# Patient Record
Sex: Male | Born: 1980
Health system: Southern US, Community
[De-identification: ages and names within clinical notes are randomized; demographics above are authoritative.]

---

## 2018-03-24 ENCOUNTER — Encounter: Payer: Self-pay | Admitting: Family Medicine

## 2018-03-24 ENCOUNTER — Ambulatory Visit (INDEPENDENT_AMBULATORY_CARE_PROVIDER_SITE_OTHER): Payer: 59 | Admitting: Family Medicine

## 2018-03-24 VITALS — BP 106/68 | HR 69 | Temp 97.9°F | Ht 68.0 in | Wt 146.0 lb

## 2018-03-24 DIAGNOSIS — M545 Low back pain, unspecified: Secondary | ICD-10-CM

## 2018-03-24 MED ORDER — CYCLOBENZAPRINE HCL 10 MG PO TABS: 10.0000 mg | ORAL_TABLET | Freq: Three times a day (TID) | ORAL | 0 refills | Status: DC | PRN

## 2018-03-24 MED ORDER — DICLOFENAC SODIUM 75 MG PO TBEC: 75.0000 mg | DELAYED_RELEASE_TABLET | Freq: Two times a day (BID) | ORAL | 0 refills | Status: DC

## 2018-03-24 NOTE — Progress Notes (Signed)
Subjective:  Cody [name] is a 37 y.o. male who presents today with a chief complaint of back pain and to establish care. History provided by patient via interpretor.  HPI:  Back Pain, Acute Issue Started about 10 days ago. Stable over that time. Pain located in left lower back. Intermittent in nature. No obvious precipitating events. No dysuria or hematuria. No constipation or diarrhea. No radiation. No treatments tried. No other obvious alleviating or aggravating factors.   ROS: Per HPI, otherwise a complete review of systems was negative.   PMH:  The following were reviewed and entered/updated in epic: History reviewed. No pertinent past medical history. There are no active problems to display for this patient.  History reviewed. No pertinent surgical history.  Family History  Problem Relation Age of Onset  . Cancer Mother     Medications- reviewed and updated Current Outpatient Medications  Medication Sig Dispense Refill  . cyclobenzaprine (FLEXERIL) 10 MG tablet Take 1 tablet (10 mg total) by mouth 3 (three) times daily as needed for muscle spasms. 30 tablet 0  . diclofenac (VOLTAREN) 75 MG EC tablet Take 1 tablet (75 mg total) by mouth 2 (two) times daily. 30 tablet 0   No current facility-administered medications for this visit.     Allergies-reviewed and updated No Known Allergies  Social History   Socioeconomic History  . Marital status: Married    Spouse name: Not on file  . Number of children: Not on file  . Years of education: Not on file  . Highest education level: Not on file  Occupational History  . Not on file  Social Needs  . Financial resource strain: Not on file  . Food insecurity:    Worry: Not on file    Inability: Not on file  . Transportation needs:    Medical: Not on file    Non-medical: Not on file  Tobacco Use  . Smoking status: Never Smoker  . Smokeless tobacco: Never Used  Substance and Sexual Activity  . Alcohol use: Never   Frequency: Never  . Drug use: Never  . Sexual activity: Never  Lifestyle  . Physical activity:    Days per week: Not on file    Minutes per session: Not on file  . Stress: Not on file  Relationships  . Social connections:    Talks on phone: Not on file    Gets together: Not on file    Attends religious service: Not on file    Active member of club or organization: Not on file    Attends meetings of clubs or organizations: Not on file    Relationship status: Not on file  Other Topics Concern  . Not on file  Social History Narrative  . Not on file   Objective:  Physical Exam: BP 106/68 (BP Location: Right Arm)   Pulse 69   Temp 97.9 F (36.6 C) (Oral)   Ht 5\' 8"  (1.727 m)   Wt 146 lb (66.2 kg)   SpO2 98%   BMI 22.20 kg/m   Gen: NAD, resting comfortably CV: RRR with no murmurs appreciated Pulm: NWOB, CTAB with no crackles, wheezes, or rhonchi GI: Normal bowel sounds present. Soft, Nontender, Nondistended. MSK: -Back: No deformities.  No spinous process tenderness.  Mildly tender to palpation along left lumbar paraspinal muscles. -Legs: No deformities.  Strength 5 out of 5 throughout.  Sensation light touch intact throughout.  Patellar reflexes 2+ and symmetric bilaterally.  Straight leg raise negative bilaterally.  Skin: Warm, dry Neuro: Grossly normal, moves all extremities Psych: Normal affect and thought content  Assessment/Plan:  Back Pain No red flag signs or symptoms.  Likely secondary to muscular strain.  Discussed conservative treatment.  Will start diclofenac 75 mg twice daily for the next 2 weeks.  Also start Flexeril 10 mg 3 times daily as needed.  Also recommended use of heat/ice therapy.  Also discussed home exercise program.  Return precautions reviewed.  Preventative healthcare Patient will return soon for CPE.  Algis Greenhouse. Jerline Pain, MD 03/24/2018 3:40 PM

## 2018-03-24 NOTE — Patient Instructions (Signed)
Start the diclofenac.  Take 1 pill twice daily for the next 2 weeks. Take Flexeril as needed. Please work on the exercises.  Please let me know if your symptoms are not improving over the next several weeks.  Please come back soon for your full physical with blood work.  Take care, Dr. Jerline Pain

## 2018-03-27 ENCOUNTER — Ambulatory Visit: Payer: Self-pay | Admitting: Family Medicine

## 2018-04-03 ENCOUNTER — Encounter: Payer: Self-pay | Admitting: Family Medicine

## 2018-04-03 ENCOUNTER — Ambulatory Visit (INDEPENDENT_AMBULATORY_CARE_PROVIDER_SITE_OTHER): Payer: 59 | Admitting: Family Medicine

## 2018-04-03 VITALS — BP 100/62 | HR 62 | Temp 98.0°F | Resp 12 | Ht 67.25 in | Wt 147.0 lb

## 2018-04-03 DIAGNOSIS — M545 Low back pain, unspecified: Secondary | ICD-10-CM

## 2018-04-03 DIAGNOSIS — Z0001 Encounter for general adult medical examination with abnormal findings: Secondary | ICD-10-CM | POA: Diagnosis not present

## 2018-04-03 DIAGNOSIS — Z13220 Encounter for screening for lipoid disorders: Secondary | ICD-10-CM

## 2018-04-03 DIAGNOSIS — Z114 Encounter for screening for human immunodeficiency virus [HIV]: Secondary | ICD-10-CM | POA: Diagnosis not present

## 2018-04-03 LAB — LIPID PANEL
CHOLESTEROL: 150 mg/dL (ref 0–200)
HDL: 44.7 mg/dL (ref >39.00)
LDL Cholesterol: 86 mg/dL (ref 0–99)
NonHDL: 104.86
Total CHOL/HDL Ratio: 3
Triglycerides: 92 mg/dL (ref 0.0–149.0)
VLDL: 18.4 mg/dL (ref 0.0–40.0)

## 2018-04-03 LAB — CBC
HEMATOCRIT: 44.2 % (ref 39.0–52.0)
HEMOGLOBIN: 15.2 g/dL (ref 13.0–17.0)
MCHC: 34.3 g/dL (ref 30.0–36.0)
MCV: 86.6 fl (ref 78.0–100.0)
Platelets: 285 10*3/uL (ref 150.0–400.0)
RBC: 5.11 Mil/uL (ref 4.22–5.81)
RDW: 12.6 % (ref 11.5–15.5)
WBC: 4.9 10*3/uL (ref 4.0–10.5)

## 2018-04-03 LAB — COMPREHENSIVE METABOLIC PANEL
ALBUMIN: 4.2 g/dL (ref 3.5–5.2)
ALT: 17 U/L (ref 0–53)
AST: 20 U/L (ref 0–37)
Alkaline Phosphatase: 56 U/L (ref 39–117)
BUN: 18 mg/dL (ref 6–23)
CHLORIDE: 104 meq/L (ref 96–112)
CO2: 30 mEq/L (ref 19–32)
Calcium: 8.7 mg/dL (ref 8.4–10.5)
Creatinine, Ser: 1.02 mg/dL (ref 0.40–1.50)
GFR: 86.74 mL/min (ref >60.00)
GLUCOSE: 86 mg/dL (ref 70–99)
POTASSIUM: 3.4 meq/L — AB (ref 3.5–5.1)
Sodium: 139 mEq/L (ref 135–145)
TOTAL PROTEIN: 6.9 g/dL (ref 6.0–8.3)
Total Bilirubin: 0.8 mg/dL (ref 0.2–1.2)

## 2018-04-03 NOTE — Patient Instructions (Signed)
Preventive Care 18-39 Years, Male Preventive care refers to lifestyle choices and visits with your health care provider that can promote health and wellness. What does preventive care include?  A yearly physical exam. This is also called an annual well check.  Dental exams once or twice a year.  Routine eye exams. Ask your health care provider how often you should have your eyes checked.  Personal lifestyle choices, including: ? Daily care of your teeth and gums. ? Regular physical activity. ? Eating a healthy diet. ? Avoiding tobacco and drug use. ? Limiting alcohol use. ? Practicing safe sex. What happens during an annual well check? The services and screenings done by your health care provider during your annual well check will depend on your age, overall health, lifestyle risk factors, and family history of disease. Counseling Your health care provider may ask you questions about your:  Alcohol use.  Tobacco use.  Drug use.  Emotional well-being.  Home and relationship well-being.  Sexual activity.  Eating habits.  Work and work Statistician.  Screening You may have the following tests or measurements:  Height, weight, and BMI.  Blood pressure.  Lipid and cholesterol levels. These may be checked every 5 years starting at age 34.  Diabetes screening. This is done by checking your blood sugar (glucose) after you have not eaten for a while (fasting).  Skin check.  Hepatitis C blood test.  Hepatitis B blood test.  Sexually transmitted disease (STD) testing.  Discuss your test results, treatment options, and if necessary, the need for more tests with your health care provider. Vaccines Your health care provider may recommend certain vaccines, such as:  Influenza vaccine. This is recommended every year.  Tetanus, diphtheria, and acellular pertussis (Tdap, Td) vaccine. You may need a Td booster every 10 years.  Varicella vaccine. You may need this if you  have not been vaccinated.  HPV vaccine. If you are 23 or younger, you may need three doses over 6 months.  Measles, mumps, and rubella (MMR) vaccine. You may need at least one dose of MMR.You may also need a second dose.  Pneumococcal 13-valent conjugate (PCV13) vaccine. You may need this if you have certain conditions and have not been vaccinated.  Pneumococcal polysaccharide (PPSV23) vaccine. You may need one or two doses if you smoke cigarettes or if you have certain conditions.  Meningococcal vaccine. One dose is recommended if you are age 65-21 years and a first-year college student living in a residence hall, or if you have one of several medical conditions. You may also need additional booster doses.  Hepatitis A vaccine. You may need this if you have certain conditions or if you travel or work in places where you may be exposed to hepatitis A.  Hepatitis B vaccine. You may need this if you have certain conditions or if you travel or work in places where you may be exposed to hepatitis B.  Haemophilus influenzae type b (Hib) vaccine. You may need this if you have certain risk factors.  Talk to your health care provider about which screenings and vaccines you need and how often you need them. This information is not intended to replace advice given to you by your health care provider. Make sure you discuss any questions you have with your health care provider. Document Released: 02/08/2002 Document Revised: 09/01/2016 Document Reviewed: 10/14/2015 Elsevier Interactive Patient Education  [employer].

## 2018-04-03 NOTE — Progress Notes (Signed)
Subjective:  Cody [name] is a 37 y.o. male who presents today for his annual comprehensive physical exam.    HPI:  He has no acute complaints today.   Lifestyle Diet: No specific diets.  Exercise: 20 minutes to 1 hour running daily. Likes to walk and run.   Depression screen PHQ 2/9 03/24/2018  Decreased Interest 0  Down, Depressed, Hopeless 0  PHQ - 2 Score 0    Health Maintenance Due  Topic Date Due  . HIV Screening  12/19/1994  . TETANUS/TDAP  12/19/1998     ROS: A complete review of systems was negative.   PMH:  The following were reviewed and entered/updated in epic: History reviewed. No pertinent past medical history. There are no active problems to display for this patient.  History reviewed. No pertinent surgical history.  Family History  Problem Relation Age of Onset  . Cancer Mother    Medications- reviewed and updated Current Outpatient Medications  Medication Sig Dispense Refill  . cyclobenzaprine (FLEXERIL) 10 MG tablet Take 1 tablet (10 mg total) by mouth 3 (three) times daily as needed for muscle spasms. 30 tablet 0  . diclofenac (VOLTAREN) 75 MG EC tablet Take 1 tablet (75 mg total) by mouth 2 (two) times daily. 30 tablet 0   No current facility-administered medications for this visit.     Allergies-reviewed and updated No Known Allergies  Social History   Socioeconomic History  . Marital status: Married    Spouse name: Not on file  . Number of children: Not on file  . Years of education: Not on file  . Highest education level: Not on file  Occupational History  . Not on file  Social Needs  . Financial resource strain: Not on file  . Food insecurity:    Worry: Not on file    Inability: Not on file  . Transportation needs:    Medical: Not on file    Non-medical: Not on file  Tobacco Use  . Smoking status: Never Smoker  . Smokeless tobacco: Never Used  Substance and Sexual Activity  . Alcohol use: Never    Frequency: Never  .  Drug use: Never  . Sexual activity: Never  Lifestyle  . Physical activity:    Days per week: Not on file    Minutes per session: Not on file  . Stress: Not on file  Relationships  . Social connections:    Talks on phone: Not on file    Gets together: Not on file    Attends religious service: Not on file    Active member of club or organization: Not on file    Attends meetings of clubs or organizations: Not on file    Relationship status: Not on file  Other Topics Concern  . Not on file  Social History Narrative  . Not on file    Objective:  Physical Exam: BP 100/62 (BP Location: Left Arm)   Pulse 62   Temp 98 F (36.7 C) (Oral)   Resp 12   Ht 5' 7.25" (1.708 m)   Wt 147 lb (66.7 kg)   SpO2 99%   BMI 22.85 kg/m   Body mass index is 22.85 kg/m. Wt Readings from Last 3 Encounters:  04/03/18 147 lb (66.7 kg)  03/24/18 146 lb (66.2 kg)   Gen: NAD, resting comfortably HEENT: TMs normal bilaterally. OP clear. No thyromegaly noted.  CV: RRR with no murmurs appreciated Pulm: NWOB, CTAB with no crackles, wheezes, or rhonchi  GI: Normal bowel sounds present. Soft, Nontender, Nondistended. MSK: no edema, cyanosis, or clubbing noted Skin: warm, dry Neuro: CN2-12 grossly intact. Strength 5/5 in upper and lower extremities. Reflexes symmetric and intact bilaterally.  Psych: Normal affect and thought content  Assessment/Plan:  Low Back Pain Significantly improved. Continue diclofenac and flexeril. Check CBC and CMET.   Preventative Healthcare: Check lipid panel and HIV antibody.  Patient Counseling(The following topics were reviewed and/or handout was given):  -Nutrition: Stressed importance of moderation in sodium/caffeine intake, saturated fat and cholesterol, caloric balance, sufficient intake of fresh fruits, vegetables, and fiber.  -Stressed the importance of regular exercise.   -Substance Abuse: Discussed cessation/primary prevention of tobacco, alcohol, or other drug  use; driving or other dangerous activities under the influence; availability of treatment for abuse.   -Injury prevention: Discussed safety belts, safety helmets, smoke detector, smoking near bedding or upholstery.   -Sexuality: Discussed sexually transmitted diseases, partner selection, use of condoms, avoidance of unintended pregnancy and contraceptive alternatives.   -Dental health: Discussed importance of regular tooth brushing, flossing, and dental visits.  -Health maintenance and immunizations reviewed. Please refer to Health maintenance section.  Return to care in 1 year for next preventative visit.   Algis Greenhouse. Jerline Pain, MD 04/03/2018 8:38 AM

## 2018-04-04 LAB — HIV ANTIBODY (ROUTINE TESTING W REFLEX): HIV 1&2 Ab, 4th Generation: NONREACTIVE

## 2019-09-27 ENCOUNTER — Encounter (HOSPITAL_COMMUNITY): Payer: Self-pay

## 2019-09-27 ENCOUNTER — Ambulatory Visit (HOSPITAL_COMMUNITY)
Admission: EM | Admit: 2019-09-27 | Discharge: 2019-09-27 | Disposition: A | Discharge status: Home / Self Care | Payer: 59 | Attending: Urgent Care | Admitting: Urgent Care

## 2019-09-27 ENCOUNTER — Ambulatory Visit (HOSPITAL_COMMUNITY)
Admission: EM | Admit: 2019-09-27 | Discharge: 2019-09-27 | Disposition: A | Discharge status: Home / Self Care | Payer: Self-pay

## 2019-09-27 ENCOUNTER — Other Ambulatory Visit: Payer: Self-pay

## 2019-09-27 DIAGNOSIS — H9201 Otalgia, right ear: Secondary | ICD-10-CM

## 2019-09-27 DIAGNOSIS — H6981 Other specified disorders of Eustachian tube, right ear: Secondary | ICD-10-CM | POA: Diagnosis not present

## 2019-09-27 MED ORDER — CETIRIZINE HCL 10 MG PO TABS: 10.0000 mg | ORAL_TABLET | Freq: Every day | ORAL | 0 refills | Status: DC

## 2019-09-27 MED ORDER — NAPROXEN 500 MG PO TABS: 500.0000 mg | ORAL_TABLET | Freq: Two times a day (BID) | ORAL | 0 refills | Status: DC

## 2019-09-27 MED ORDER — PSEUDOEPHEDRINE HCL ER 120 MG PO TB12: 120.0000 mg | ORAL_TABLET | Freq: Two times a day (BID) | ORAL | 0 refills | Status: DC | PRN

## 2019-09-27 NOTE — ED Triage Notes (Signed)
Pt states he has right ear discomfort. Pt states he saw some blood coming out of his right ear.

## 2019-09-27 NOTE — Discharge Instructions (Addendum)
Start an antihistamine like Zyrtec for postnasal drainage, sinus congestion and fluid in your sinuses/middle ear space.  Take 10mg  cetirizine (Zyrtec) every day through the end of October. You can take this together with pseudoephedrine (Sudafed) at a dose of 60 mg 3 times a day oral 120 mg twice daily as needed for the same kind of congestion. Take this daily for the next 2-3 days. Then use it only if you need it. For ear pain you can also take naproxen (Naprosyn) 500mg  twice daily if you need it.

## 2019-09-27 NOTE — ED Provider Notes (Signed)
  MRN: [id number] DOB: 06/14/1981  Subjective:   Cody [name] is a 38 y.o. male presenting for several day history of persistent intermittent right ear pain that is mild to moderate in nature, has associated ear popping and occasional dizziness.  He also reports that his eyes get intermittently red but are not painful.  States that he tried to clean it out yesterday with a Q-tip and felt some pain.  He wanted to make sure that he does not have an infection today.  Denies any known COVID-19 contacts.  Denies fever, sinus pain, throat pain, cough, chest pain, shortness of breath, wheezing, nausea, vomiting, belly pain. He is not currently taking any medications and has no known food or drug allergies.  Denies past medical and surgical history.   ROS  Objective:   Vitals: BP 101/63 (BP Location: Right Arm)   Pulse 69   Temp 98.1 F (36.7 C) (Oral)   Resp 16   Wt 120 lb (54.4 kg)   SpO2 98%   BMI 18.66 kg/m   Physical Exam Constitutional:      General: He is not in acute distress.    Appearance: Normal appearance. He is well-developed and normal weight. He is not ill-appearing.  HENT:     Head: Normocephalic and atraumatic.     Comments: Mild resolving abrasion of right distal ear canal.    Right Ear: Tympanic membrane and ear canal normal. There is no impacted cerumen.     Left Ear: Tympanic membrane, ear canal and external ear normal. There is no impacted cerumen.     Nose: Nose normal. No congestion or rhinorrhea.     Mouth/Throat:     Mouth: Mucous membranes are moist.     Pharynx: Oropharynx is clear. No oropharyngeal exudate or posterior oropharyngeal erythema.  Eyes:     General: No scleral icterus.       Right eye: No discharge.        Left eye: No discharge.     Extraocular Movements: Extraocular movements intact.     Conjunctiva/sclera:     Right eye: Right conjunctiva is not injected. No chemosis, exudate or hemorrhage.    Left eye: Left conjunctiva is injected. No  chemosis, exudate or hemorrhage.    Pupils: Pupils are equal, round, and reactive to light.  Neck:     Musculoskeletal: Normal range of motion and neck supple. No neck rigidity or muscular tenderness.  Cardiovascular:     Rate and Rhythm: Normal rate.  Pulmonary:     Effort: Pulmonary effort is normal.  Neurological:     General: No focal deficit present.     Mental Status: He is alert and oriented to person, place, and time.  Psychiatric:        Mood and Affect: Mood normal.        Behavior: Behavior normal.     Assessment and Plan :   1. Eustachian tube dysfunction, right   2. Otalgia of right ear     Counseled patient on eustachian tube dysfunction and will try to address this with daily Zyrtec and pseudoephedrine.  Recommended patient stop using Q-tips as he suffered an abrasion to his right ear canal. Counseled patient on potential for adverse effects with medications prescribed/recommended today, ER and return-to-clinic precautions discussed, patient verbalized understanding.     Jaynee Eagles, PA-C 09/27/19 1301

## 2019-10-08 ENCOUNTER — Other Ambulatory Visit: Payer: Self-pay

## 2019-10-08 ENCOUNTER — Encounter (HOSPITAL_COMMUNITY): Payer: Self-pay

## 2019-10-08 ENCOUNTER — Ambulatory Visit (HOSPITAL_COMMUNITY)
Admission: EM | Admit: 2019-10-08 | Discharge: 2019-10-08 | Disposition: A | Discharge status: Home / Self Care | Payer: 59 | Attending: Family Medicine | Admitting: Family Medicine

## 2019-10-08 DIAGNOSIS — H9201 Otalgia, right ear: Secondary | ICD-10-CM

## 2019-10-08 DIAGNOSIS — H6991 Unspecified Eustachian tube disorder, right ear: Secondary | ICD-10-CM

## 2019-10-08 MED ORDER — FLUTICASONE PROPIONATE 50 MCG/ACT NA SUSP: 1.0000 | Freq: Every day | NASAL | 0 refills | Status: DC

## 2019-10-08 MED ORDER — CETIRIZINE HCL 10 MG PO TABS: 10.0000 mg | ORAL_TABLET | Freq: Every day | ORAL | 0 refills | Status: DC

## 2019-10-08 NOTE — ED Triage Notes (Signed)
Pt following up from previous visit concerning his right ear. Pt right ear is still ringing and possible some hearing loss.

## 2019-10-08 NOTE — Discharge Instructions (Signed)
I do not see any indication for antibiotics here today.  Please use flonase nasal spray daily as this may help with your ear symptoms.  Please continue with daily zyrtec as well.  Please follow up with your PCP and/or ENT for recheck if symptoms persist.

## 2019-10-08 NOTE — ED Provider Notes (Signed)
[facility]    CSN: [id number] Arrival date & time: 10/08/19  1203      History   Chief Complaint Chief Complaint  Patient presents with  . Otalgia  . Sore Throat    HPI Cody [name] is a 38 y.o. male.   Cody [name] presents with complaints of right ear pain. It is intermittent. Feels worse in cold temperatures or if he is near a window with cold air. He sometimes has ringing to the ear and he feels like the hearing is decreased. States he has had similar in the past and took antibiotics. Was seen here for this 10/1 and given antihistamines and decongestants for eustachian tube dysfunction. Patient states that these have minimally helped. No fevers. Sometimes sore throat on the right hand side also.    ROS per HPI, negative if not otherwise mentioned.      History reviewed. No pertinent past medical history.  There are no active problems to display for this patient.   History reviewed. No pertinent surgical history.     Home Medications    Prior to Admission medications   Medication Sig Start Date End Date Taking? Authorizing Provider  cetirizine (ZYRTEC ALLERGY) 10 MG tablet Take 1 tablet (10 mg total) by mouth daily. 10/08/19   Zigmund Gottron, NP  cyclobenzaprine (FLEXERIL) 10 MG tablet Take 1 tablet (10 mg total) by mouth 3 (three) times daily as needed for muscle spasms. 03/24/18   Vivi Barrack, MD  diclofenac (VOLTAREN) 75 MG EC tablet Take 1 tablet (75 mg total) by mouth 2 (two) times daily. 03/24/18   Vivi Barrack, MD  fluticasone (FLONASE) 50 MCG/ACT nasal spray Place 1 spray into both nostrils daily. 10/08/19   Zigmund Gottron, NP  naproxen (NAPROSYN) 500 MG tablet Take 1 tablet (500 mg total) by mouth 2 (two) times daily. 09/27/19   Jaynee Eagles, PA-C  pseudoephedrine (SUDAFED 12 HOUR) 120 MG 12 hr tablet Take 1 tablet (120 mg total) by mouth every 12 (twelve) hours as needed for congestion. 09/27/19   Jaynee Eagles, PA-C    Family History  Family History  Problem Relation Age of Onset  . Cancer Mother     Social History Social History   Tobacco Use  . Smoking status: Never Smoker  . Smokeless tobacco: Never Used  Substance Use Topics  . Alcohol use: Never    Frequency: Never  . Drug use: Never     Allergies   Patient has no known allergies.   Review of Systems Review of Systems   Physical Exam Triage Vital Signs ED Triage Vitals  Enc Vitals Group     BP 10/08/19 1255 115/72     Pulse Rate 10/08/19 1255 79     Resp 10/08/19 1255 16     Temp 10/08/19 1255 97.9 F (36.6 C)     Temp Source 10/08/19 1255 Skin     SpO2 10/08/19 1255 98 %     Weight --      Height --      Head Circumference --      Peak Flow --      Pain Score 10/08/19 1253 2     Pain Loc --      Pain Edu? --      Excl. in [place]? --    No data found.  Updated Vital Signs BP 115/72 (BP Location: Right Arm)   Pulse 79   Temp 97.9 F (36.6 C) (Skin)  Resp 16   SpO2 98%    Physical Exam Constitutional:      Appearance: He is well-developed.  HENT:     Right Ear: Tympanic membrane and ear canal normal. No drainage or swelling. No middle ear effusion.     Left Ear: Tympanic membrane and ear canal normal.     Mouth/Throat:     Tonsils: No tonsillar exudate or tonsillar abscesses. 1+ on the right. 1+ on the left.  Cardiovascular:     Rate and Rhythm: Normal rate and regular rhythm.  Pulmonary:     Effort: Pulmonary effort is normal.     Breath sounds: Normal breath sounds.  Skin:    General: Skin is warm and dry.  Neurological:     Mental Status: He is alert and oriented to person, place, and time.      UC Treatments / Results  Labs (all labs ordered are listed, but only abnormal results are displayed) Labs Reviewed - No data to display  EKG   Radiology No results found.  Procedures Procedures (including critical care time)  Medications Ordered in UC Medications - No data to display  Initial Impression /  Assessment and Plan / UC Course  I have reviewed the triage vital signs and the nursing notes.  Pertinent labs & imaging results that were available during my care of the patient were reviewed by me and considered in my medical decision making (see chart for details).     Ear appears generally WNL here today without acute findings. Patient concerned he needs antibiotics but I do not see any indication for this today. flonase added to regimen for eustachian tube disorder. Encouraged follow up with his pcp and/or ENT. Patient verbalized understanding and agreeable to plan.    Final Clinical Impressions(s) / UC Diagnoses   Final diagnoses:  Otalgia of right ear  Eustachian tube disorder, right     Discharge Instructions     I do not see any indication for antibiotics here today.  Please use flonase nasal spray daily as this may help with your ear symptoms.  Please continue with daily zyrtec as well.  Please follow up with your PCP and/or ENT for recheck if symptoms persist.    ED Prescriptions    Medication Sig Dispense Auth. Provider   cetirizine (ZYRTEC ALLERGY) 10 MG tablet Take 1 tablet (10 mg total) by mouth daily. 90 tablet Brenetta Penny, Lanelle Bal B, NP   fluticasone (FLONASE) 50 MCG/ACT nasal spray Place 1 spray into both nostrils daily. 16 g Zigmund Gottron, NP     PDMP not reviewed this encounter.   Zigmund Gottron, NP 10/08/19 1432

## 2019-10-15 ENCOUNTER — Ambulatory Visit (INDEPENDENT_AMBULATORY_CARE_PROVIDER_SITE_OTHER): Payer: 59 | Admitting: Family Medicine

## 2019-10-15 ENCOUNTER — Other Ambulatory Visit: Payer: Self-pay

## 2019-10-15 DIAGNOSIS — H9201 Otalgia, right ear: Secondary | ICD-10-CM | POA: Diagnosis not present

## 2019-10-15 MED ORDER — AMOXICILLIN 875 MG PO TABS: 875.0000 mg | ORAL_TABLET | Freq: Two times a day (BID) | ORAL | 0 refills | Status: DC

## 2019-10-15 MED ORDER — PREDNISONE 50 MG PO TABS: ORAL_TABLET | ORAL | 0 refills | Status: DC

## 2019-10-15 NOTE — Progress Notes (Signed)
   Chief Complaint:  Cody [name] is a 38 y.o. male who presents for same day appointment with a chief complaint of ear [name].   Assessment/Plan:  Otalgia Patient with clear effusion and evidence of eustachian tube dysfunction.  He has tried and failed Flonase, Zyrtec, and Sudafed.  Will start 5-day burst of prednisone.  Also send in a "pocket prescription" for amoxicillin with strict instruction to not start unless symptoms do not improve the next couple of days.  If continues have issues, will need referral to ENT.  Discussed reasons to return to care.     Subjective:  HPI:  Ear [name], acute problem Started 2 to 3 weeks ago.  Located in right ear.  Has went to urgent care twice.  Was started on Flonase and pseudoephedrine.  Has not had any significant improvement.  Has some sore throat in the morning.  No reported fevers or chills.  No other treatments tried.  Symptoms are stable.  No other obvious alleviating or aggravating factors.    ROS: Per HPI  PMH: He reports that he has never smoked. He has never used smokeless tobacco. He reports that he does not drink alcohol or use drugs.      Objective:  Physical Exam: Gen: NAD, resting comfortably HEENT: Right TM with clear effusion.  Left TM with clear effusion.  Oropharynx slightly erythematous.  No exudate.     Cody [name]. Cody Pain, MD 10/15/2019 1:14 PM

## 2019-10-25 ENCOUNTER — Other Ambulatory Visit: Payer: Self-pay

## 2019-10-25 ENCOUNTER — Telehealth: Payer: Self-pay | Admitting: Family Medicine

## 2019-10-25 MED ORDER — AMOXICILLIN 875 MG PO TABS: 875.0000 mg | ORAL_TABLET | Freq: Two times a day (BID) | ORAL | 0 refills | Status: DC

## 2019-10-25 MED ORDER — PREDNISONE 50 MG PO TABS: ORAL_TABLET | ORAL | 0 refills | Status: DC

## 2019-10-25 NOTE — Telephone Encounter (Signed)
See note  Copied from [facility] [id number]. Topic: General - Other >> Oct 25, 2019  2:27 PM Carolyn Stare wrote: Pt said he better but his ear still is not better req a refill on below meds   amoxicillin (AMOXIL) 875 MG tablet    predniSONE (DELTASONE) 50 MG tablet   [facility]

## 2019-10-25 NOTE — Telephone Encounter (Signed)
Rx sent to pharmacy   

## 2019-10-25 NOTE — Telephone Encounter (Signed)
[place] with one more round of antibiotics and prednisone. Will need ENT referral if still having issues.  Algis Greenhouse. Jerline Pain, MD 10/25/2019 2:55 PM

## 2019-10-25 NOTE — Telephone Encounter (Signed)
Please advise 

## 2019-12-05 ENCOUNTER — Other Ambulatory Visit: Payer: Self-pay | Admitting: Otolaryngology

## 2019-12-05 DIAGNOSIS — E010 Iodine-deficiency related diffuse (endemic) goiter: Secondary | ICD-10-CM

## 2019-12-13 ENCOUNTER — Ambulatory Visit
Admission: RE | Admit: 2019-12-13 | Discharge: 2019-12-13 | Disposition: A | Discharge status: Home / Self Care | Payer: 59 | Source: Ambulatory Visit | Attending: Otolaryngology | Admitting: Otolaryngology

## 2019-12-13 DIAGNOSIS — E010 Iodine-deficiency related diffuse (endemic) goiter: Secondary | ICD-10-CM

## 2019-12-24 ENCOUNTER — Other Ambulatory Visit: Payer: Self-pay | Admitting: Otolaryngology

## 2019-12-24 DIAGNOSIS — H918X1 Other specified hearing loss, right ear: Secondary | ICD-10-CM

## 2019-12-24 DIAGNOSIS — IMO0001 Reserved for inherently not codable concepts without codable children: Secondary | ICD-10-CM

## 2019-12-24 DIAGNOSIS — H9311 Tinnitus, right ear: Secondary | ICD-10-CM

## 2020-08-09 IMAGING — US US THYROID
1 series · 14 of 25 positions shown · non-contrast
Comparison: None.

CLINICAL DATA: 37-year-old male with a history of thyroid
enlargement

EXAM:
THYROID ULTRASOUND
TECHNIQUE: Ultrasound examination of the thyroid gland and adjacent soft
tissues was performed.

[Series 1: us thyroid · 0.04mm/px · 14 of 39 slices shown]
[im 1/39]
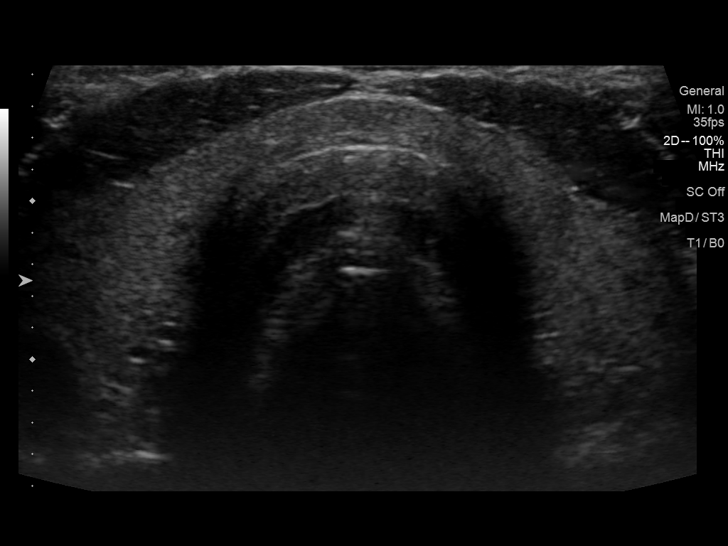
[im 4/39]
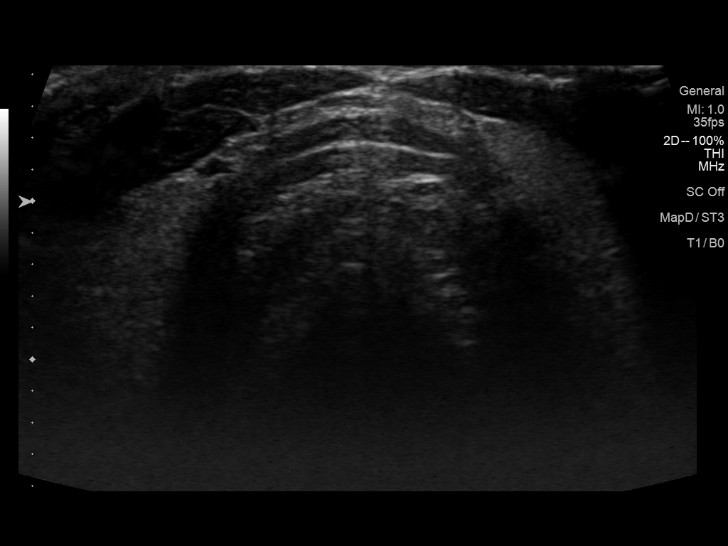
[im 7/39]
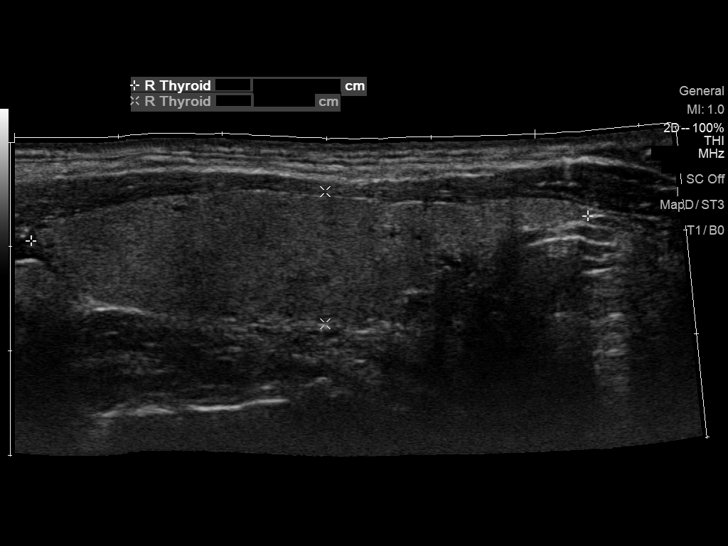
[im 10/39]
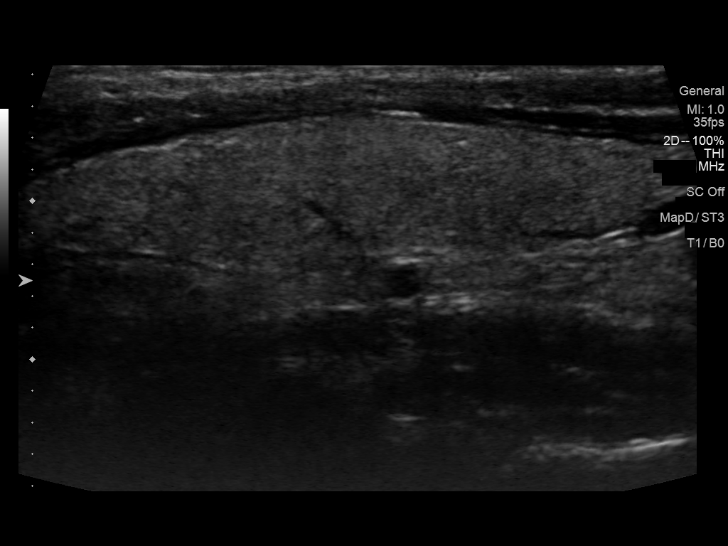
[im 13/39]
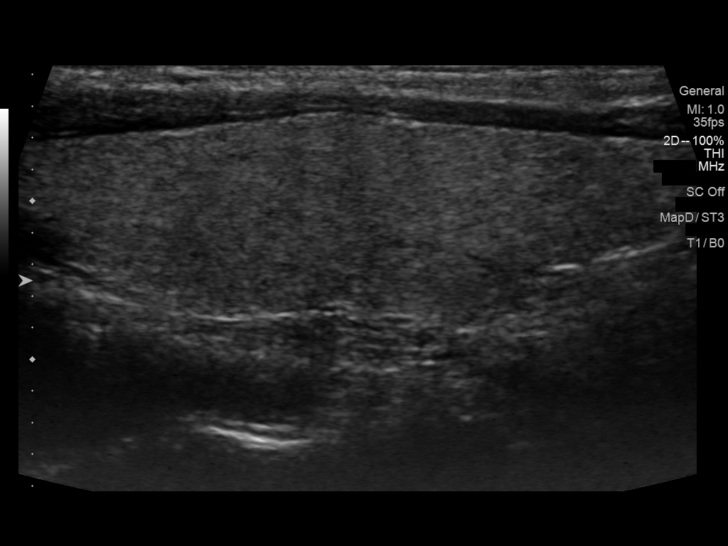
[im 15/39]
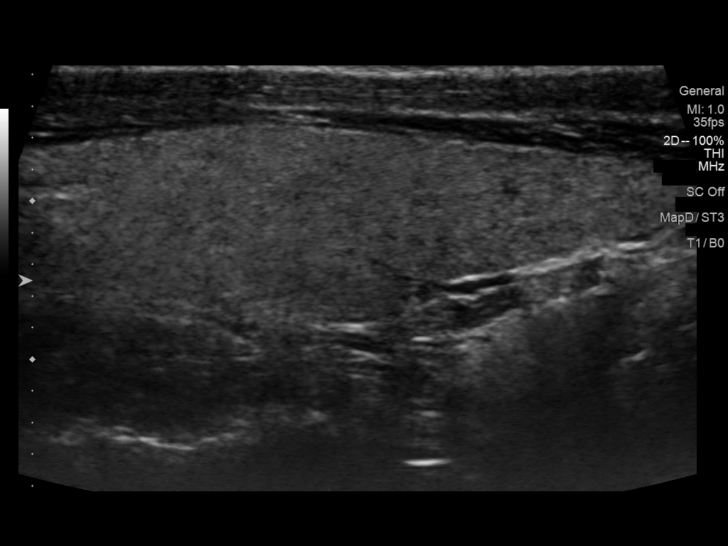
[im 18/39]
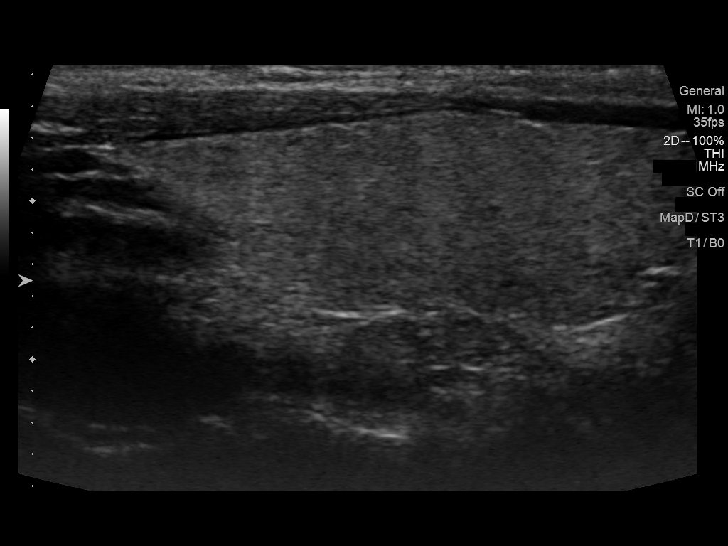
[im 21/39]
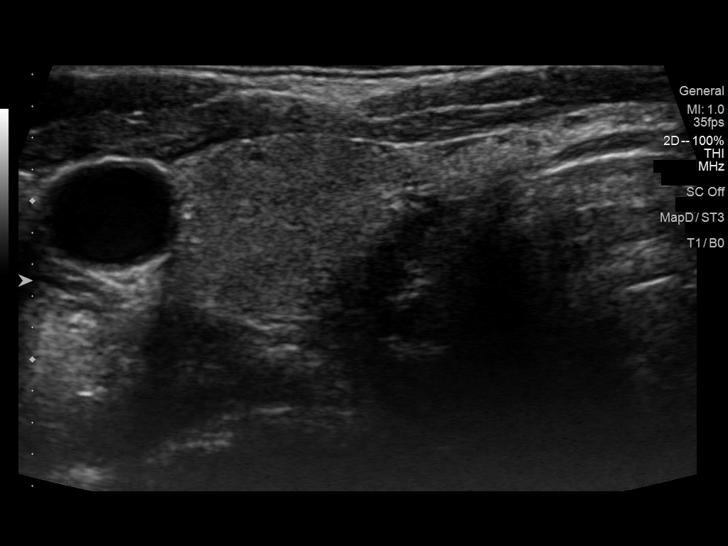
[im 24/39]
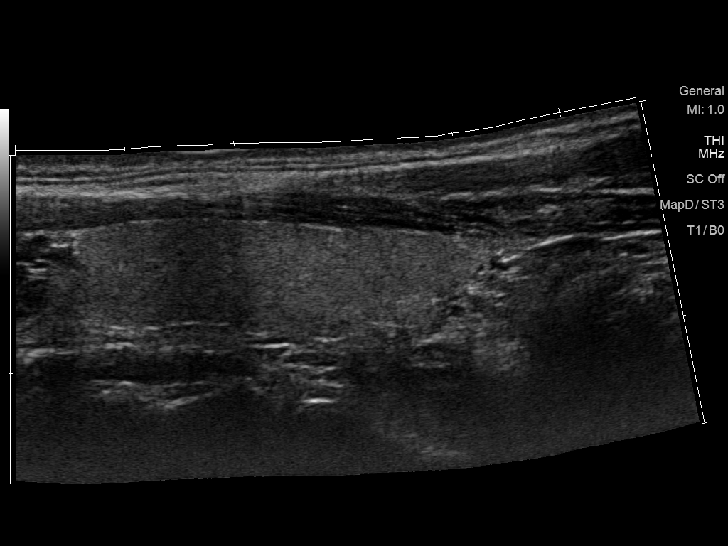
[im 26/39]
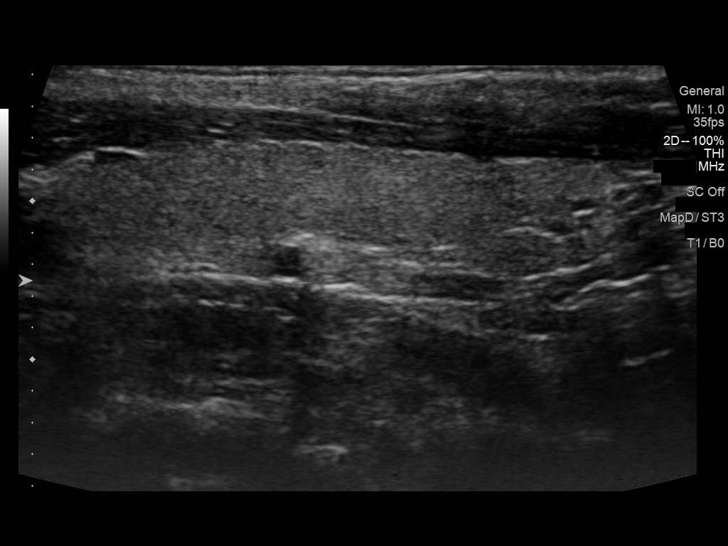
[im 29/39]
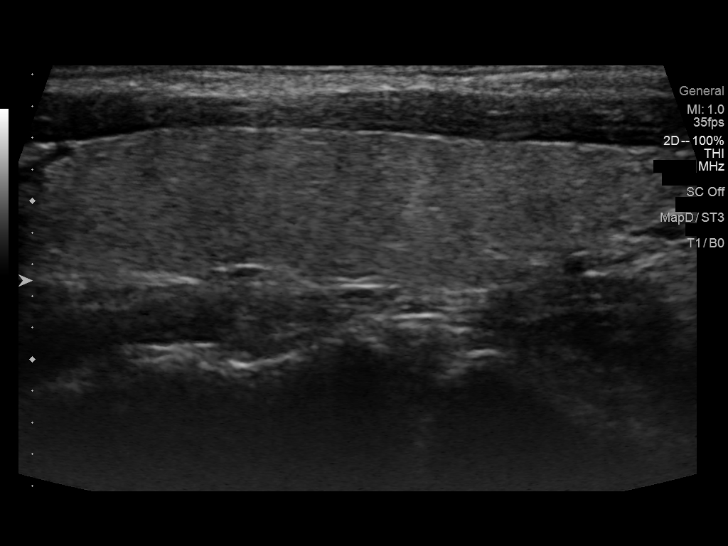
[im 32/39]
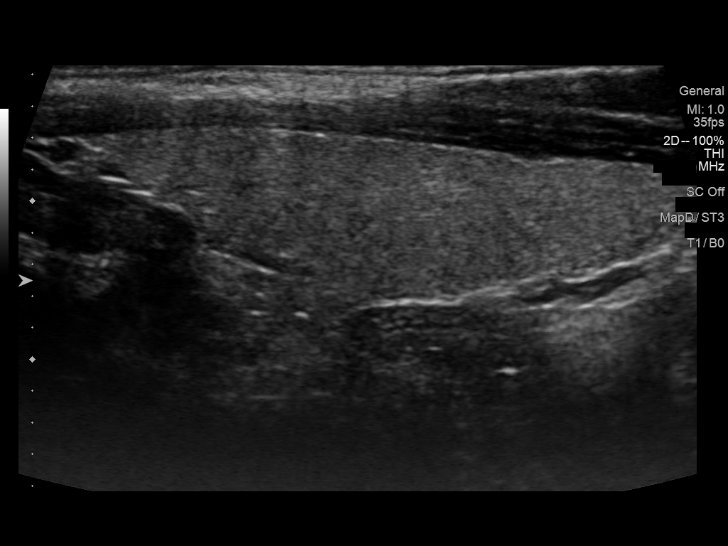
[im 35/39]
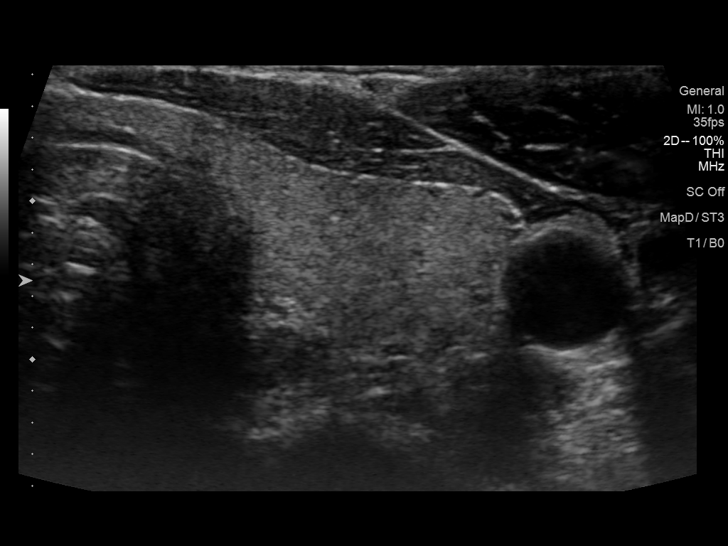
[im 39/39]
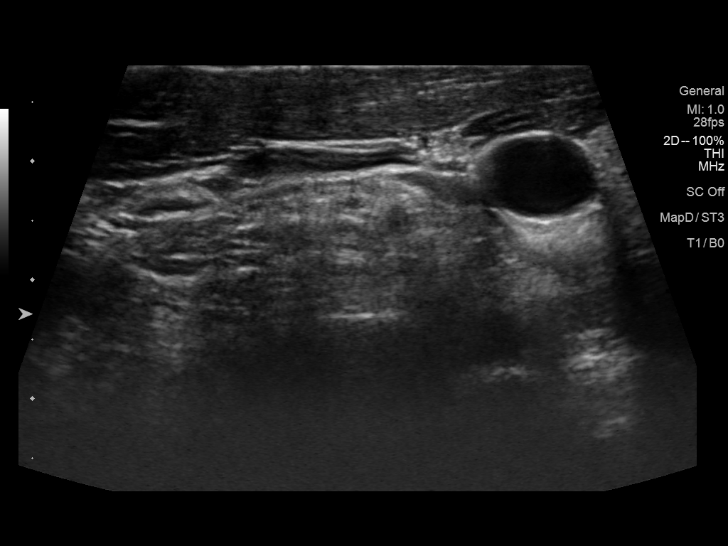

[14 of 25 positions shown; findings below may reference images not displayed]

FINDINGS: Parenchymal Echotexture: Normal

Isthmus: 0.3 cm

Right lobe: 5.3 cm x 1.3 cm x 1.7 cm

Left lobe: 3.9 cm x 1.1 cm x 1.7 cm

_________________________________________________________

Estimated total number of nodules >/= 1 cm: 0

Number of spongiform nodules >/=  2 cm not described below (TR1): 0

Number of mixed cystic and solid nodules >/= 1.5 cm not described
below (TR2): 0

_________________________________________________________

No discrete nodules are seen within the thyroid gland.

No adenopathy
IMPRESSION: Relatively unremarkable sonographic survey of the thyroid.

## 2022-07-05 ENCOUNTER — Ambulatory Visit (INDEPENDENT_AMBULATORY_CARE_PROVIDER_SITE_OTHER): Payer: 59 | Admitting: Family Medicine

## 2022-07-05 ENCOUNTER — Encounter: Payer: Self-pay | Admitting: Family Medicine

## 2022-07-05 VITALS — BP 102/64 | HR 77 | Temp 98.0°F | Ht 67.0 in | Wt 142.8 lb

## 2022-07-05 DIAGNOSIS — Z131 Encounter for screening for diabetes mellitus: Secondary | ICD-10-CM | POA: Diagnosis not present

## 2022-07-05 DIAGNOSIS — D179 Benign lipomatous neoplasm, unspecified: Secondary | ICD-10-CM

## 2022-07-05 DIAGNOSIS — Z0001 Encounter for general adult medical examination with abnormal findings: Secondary | ICD-10-CM | POA: Diagnosis not present

## 2022-07-05 DIAGNOSIS — Z13220 Encounter for screening for lipoid disorders: Secondary | ICD-10-CM

## 2022-07-05 DIAGNOSIS — Z23 Encounter for immunization: Secondary | ICD-10-CM

## 2022-07-05 HISTORY — DX: Benign lipomatous neoplasm, unspecified: D17.9

## 2022-07-05 LAB — COMPREHENSIVE METABOLIC PANEL
ALT: 19 U/L (ref 0–53)
AST: 20 U/L (ref 0–37)
Albumin: 4.3 g/dL (ref 3.5–5.2)
Alkaline Phosphatase: 61 U/L (ref 39–117)
BUN: 15 mg/dL (ref 6–23)
CO2: 30 mEq/L (ref 19–32)
Calcium: 9.1 mg/dL (ref 8.4–10.5)
Chloride: 104 mEq/L (ref 96–112)
Creatinine, Ser: 0.93 mg/dL (ref 0.40–1.50)
GFR: 101.25 mL/min (ref >60.00)
Glucose, Bld: 79 mg/dL (ref 70–99)
Potassium: 3.3 mEq/L — ABNORMAL LOW (ref 3.5–5.1)
Sodium: 141 mEq/L (ref 135–145)
Total Bilirubin: 1 mg/dL (ref 0.2–1.2)
Total Protein: 6.7 g/dL (ref 6.0–8.3)

## 2022-07-05 LAB — LIPID PANEL
Cholesterol: 148 mg/dL (ref 0–200)
HDL: 54.9 mg/dL (ref >39.00)
LDL Cholesterol: 75 mg/dL (ref 0–99)
NonHDL: 92.97
Total CHOL/HDL Ratio: 3
Triglycerides: 92 mg/dL (ref 0.0–149.0)
VLDL: 18.4 mg/dL (ref 0.0–40.0)

## 2022-07-05 LAB — CBC
HCT: 41.8 % (ref 39.0–52.0)
Hemoglobin: 14.2 g/dL (ref 13.0–17.0)
MCHC: 34.1 g/dL (ref 30.0–36.0)
MCV: 87 fl (ref 78.0–100.0)
Platelets: 242 10*3/uL (ref 150.0–400.0)
RBC: 4.8 Mil/uL (ref 4.22–5.81)
RDW: 12.9 % (ref 11.5–15.5)
WBC: 4.8 10*3/uL (ref 4.0–10.5)

## 2022-07-05 LAB — TSH: TSH: 1.59 u[IU]/mL (ref 0.35–5.50)

## 2022-07-05 LAB — HEMOGLOBIN A1C: Hgb A1c MFr Bld: 5.5 % (ref 4.6–6.5)

## 2022-07-05 NOTE — Assessment & Plan Note (Signed)
No red flags.  It is reassuring that this has been present for the past year without much change.  We discussed watchful waiting versus imaging.  We will continue with watchful waiting for now.  We discussed reasons to return to care.  Follow-up as needed.

## 2022-07-05 NOTE — Addendum Note (Signed)
Addended by: Betti Cruz on: 07/05/2022 10:58 AM   Modules accepted: Orders

## 2022-07-05 NOTE — Patient Instructions (Signed)
It was very nice to see you today!  You probably have a lipoma.  Please let me know if this increases in size or becomes painful.  We will give your tetanus shot today and check blood work.  Please continue to work on diet and exercise.  I will see back in year.  Come back sooner if needed.  Take care, Dr Jerline Pain  PLEASE NOTE:  If you had any lab tests please let us know if you have not heard back within a few days. You may see your results on mychart before we have a chance to review them but we will give you a call once they are reviewed by [place]. If we ordered any referrals today, please let us know if you have not heard from their office within the next week.   Please try these tips to maintain a healthy lifestyle:  Eat at least 3 REAL meals and 1-2 snacks per day.  Aim for no more than 5 hours between eating.  If you eat breakfast, please do so within one hour of getting up.   Each meal should contain half fruits/vegetables, one quarter protein, and one quarter carbs (no bigger than a computer mouse)  Cut down on sweet beverages. This includes juice, soda, and sweet tea.   Drink at least 1 glass of water with each meal and aim for at least 8 glasses per day  Exercise at least 150 minutes every week.

## 2022-07-05 NOTE — Progress Notes (Signed)
Chief Complaint:  Cody [name] is a 41 y.o. male who presents today for his annual comprehensive physical exam.    Assessment/Plan:  Chronic Problems Addressed Today: Lipoma No red flags.  It is reassuring that this has been present for the past year without much change.  We discussed watchful waiting versus imaging.  We will continue with watchful waiting for now.  We discussed reasons to return to care.  Follow-up as needed.  Preventative Healthcare: Check labs. Tdap given today.   Patient Counseling(The following topics were reviewed and/or handout was given):  -Nutrition: Stressed importance of moderation in sodium/caffeine intake, saturated fat and cholesterol, caloric balance, sufficient intake of fresh fruits, vegetables, and fiber.  -Stressed the importance of regular exercise.   -Substance Abuse: Discussed cessation/primary prevention of tobacco, alcohol, or other drug use; driving or other dangerous activities under the influence; availability of treatment for abuse.   -Injury prevention: Discussed safety belts, safety helmets, smoke detector, smoking near bedding or upholstery.   -Sexuality: Discussed sexually transmitted diseases, partner selection, use of condoms, avoidance of unintended pregnancy and contraceptive alternatives.   -Dental health: Discussed importance of regular tooth brushing, flossing, and dental visits.  -Health maintenance and immunizations reviewed. Please refer to Health maintenance section.  Return to care in 1 year for next preventative visit.     Subjective:  HPI:  He has no acute complaints today.   He has noticed a lump on his right thigh over the last year. He saw a doctor in [place] who told him that it was a tumor. No obvious injuries or precipitating events.  It has not changed over the past year.  No pain.  Lifestyle Diet: Balanced. Exercise: Regular.      07/05/2022   10:21 AM  Depression screen PHQ 2/9  Decreased Interest 0  Down,  Depressed, Hopeless 0  PHQ - 2 Score 0    Health Maintenance Due  Topic Date Due   COVID-19 Vaccine (1) Never done   Hepatitis C Screening  Never done   TETANUS/TDAP  Never done     ROS: Per HPI, otherwise a complete review of systems was negative.   PMH:  The following were reviewed and entered/updated in epic: History reviewed. No pertinent past medical history. Patient Active Problem List   Diagnosis Date Noted   Lipoma 07/05/2022   History reviewed. No pertinent surgical history.  Family History  Problem Relation Age of Onset   Cancer Mother     Medications- reviewed and updated No current outpatient medications on file.   No current facility-administered medications for this visit.    Allergies-reviewed and updated No Known Allergies  Social History   Socioeconomic History   Marital status: Married    Spouse name: Not on file   Number of children: Not on file   Years of education: Not on file   Highest education level: Not on file  Occupational History   Not on file  Tobacco Use   Smoking status: Never   Smokeless tobacco: Never  Substance and Sexual Activity   Alcohol use: Never   Drug use: Never   Sexual activity: Never  Other Topics Concern   Not on file  Social History Narrative   Not on file   Social Determinants of Health   Financial Resource Strain: Not on file  Food Insecurity: Not on file  Transportation Needs: Not on file  Physical Activity: Not on file  Stress: Not on file  Social Connections: Not on  file        Objective:  Physical Exam: BP 102/64   Pulse 77   Temp 98 F (36.7 C) (Temporal)   Ht '5\' 7"'$  (1.702 m)   Wt 142 lb 12.8 oz (64.8 kg)   SpO2 97%   BMI 22.37 kg/m   Body mass index is 22.37 kg/m. Wt Readings from Last 3 Encounters:  07/05/22 142 lb 12.8 oz (64.8 kg)  09/27/19 120 lb (54.4 kg)  04/03/18 147 lb (66.7 kg)   Gen: NAD, resting comfortably HEENT: TMs normal bilaterally. OP clear. No thyromegaly  noted.  CV: RRR with no murmurs appreciated Pulm: NWOB, CTAB with no crackles, wheezes, or rhonchi GI: Normal bowel sounds present. Soft, Nontender, Nondistended. MSK: no edema, cyanosis, or clubbing noted - Right leg: Approximately 1cm freely mobile mass on right lateral thigh Skin: warm, dry Neuro: CN2-12 grossly intact. Strength 5/5 in upper and lower extremities. Reflexes symmetric and intact bilaterally.  Psych: Normal affect and thought content     Kipp Shank M. Jerline Pain, MD 07/05/2022 10:49 AM

## 2022-07-07 NOTE — Progress Notes (Signed)
Please inform patient of the following:  Labs are all stable.  (Keep up the good work and we can recheck in a year.

## 2022-07-19 ENCOUNTER — Telehealth: Payer: Self-pay | Admitting: Family Medicine

## 2022-07-19 NOTE — Telephone Encounter (Signed)
Patient's spouse has called in regard to labs.  I have given her Dr. Marigene Ehlers response in regard.  Spouse understood.

## 2022-10-22 ENCOUNTER — Encounter: Payer: Self-pay | Admitting: Family Medicine

## 2022-10-22 ENCOUNTER — Ambulatory Visit (INDEPENDENT_AMBULATORY_CARE_PROVIDER_SITE_OTHER): Payer: 59 | Admitting: Family Medicine

## 2022-10-22 VITALS — BP 117/74 | HR 76 | Temp 97.1°F | Ht 67.0 in | Wt 144.2 lb

## 2022-10-22 DIAGNOSIS — Z0184 Encounter for antibody response examination: Secondary | ICD-10-CM | POA: Diagnosis not present

## 2022-10-22 DIAGNOSIS — D179 Benign lipomatous neoplasm, unspecified: Secondary | ICD-10-CM

## 2022-10-22 NOTE — Patient Instructions (Addendum)
It was very nice to see you today!  We will check blood work today for your required vaccines.  Please check with the health department to schedule an appointment with a physician for an immigration physical.  You can have this done at done at the [facility]. They will also be able to get you up to date on your vaccines.   Take care, Dr Jerline Pain  PLEASE NOTE:  If you had any lab tests please let us know if you have not heard back within a few days. You may see your results on mychart before we have a chance to review them but we will give you a call once they are reviewed by [place]. If we ordered any referrals today, please let us know if you have not heard from their office within the next week.   Please try these tips to maintain a healthy lifestyle:  Eat at least 3 REAL meals and 1-2 snacks per day.  Aim for no more than 5 hours between eating.  If you eat breakfast, please do so within one hour of getting up.   Each meal should contain half fruits/vegetables, one quarter protein, and one quarter carbs (no bigger than a computer mouse)  Cut down on sweet beverages. This includes juice, soda, and sweet tea.   Drink at least 1 glass of water with each meal and aim for at least 8 glasses per day  Exercise at least 150 minutes every week.

## 2022-10-22 NOTE — Progress Notes (Signed)
   Timtohy Kuzniar is a 41 y.o. male who presents today for an office visit.  Assessment/Plan:  New/Acute Problems: Encounter for vaccine discussion He does have vaccine records from [place] today however it is not clear what vaccines he received.  His interpreter today is unable to translate this for [place] either.  We did advise him to follow-up with the health department or immigration physician to make sure that he is up-to-date on all required immunizations and screening for his immigration process.  We will check immunity status today for MMR, varicella, hep A, and hep B and we can update as needed.  Chronic Problems Addressed Today: Lipoma No red flags.  No change since last time.  Continue with watchful waiting.    Subjective:  HPI:  See A/p for status of chronic conditions.   He is in the process of immigrating and wants to make sure that he is up-to-date on all his vaccines.  Does have vaccine records from [place] with him today.       Objective:  Physical Exam: BP 117/74   Pulse 76   Temp (!) 97.1 F (36.2 C) (Temporal)   Ht _0  (1.702 m)   Wt 144 lb 3.2 oz (65.4 kg)   SpO2 97%   BMI 22.58 kg/m   Gen: No acute distress, resting comfortably CV: Regular rate and rhythm with no murmurs appreciated Pulm: Normal work of breathing, clear to auscultation bilaterally with no crackles, wheezes, or rhonchi Neuro: Grossly normal, moves all extremities Psych: Normal affect and thought content      Merial Moritz M. Jerline Pain, MD 10/22/2022 9:56 AM

## 2022-10-22 NOTE — Assessment & Plan Note (Addendum)
No red flags.  No change since last time.  Continue with watchful waiting.

## 2022-10-24 LAB — HEPATITIS A ANTIBODY, TOTAL: Hepatitis A AB,Total: REACTIVE — AB

## 2022-10-24 LAB — MEASLES/MUMPS/RUBELLA IMMUNITY
Mumps IgG: 9.56 AU/mL — ABNORMAL LOW
Rubella: 13.3 Index
Rubeola IgG: >300 AU/mL

## 2022-10-24 LAB — HEPATITIS B SURFACE ANTIBODY,QUALITATIVE: Hep B S Ab: REACTIVE — AB

## 2022-10-24 LAB — HEPATITIS B SURFACE ANTIGEN: Hepatitis B Surface Ag: NONREACTIVE

## 2022-10-24 LAB — VARICELLA ZOSTER ANTIBODY, IGG: Varicella IgG: 886.9 index

## 2022-10-25 NOTE — Progress Notes (Signed)
Please inform patient of the following:  His labs show that he is immune to everything we tested.  He does not need MMR, varicella, hep A, or hep B vaccines.

## 2023-04-01 ENCOUNTER — Ambulatory Visit (INDEPENDENT_AMBULATORY_CARE_PROVIDER_SITE_OTHER): Payer: 59 | Admitting: Family Medicine

## 2023-04-01 ENCOUNTER — Encounter: Payer: Self-pay | Admitting: Family Medicine

## 2023-04-01 VITALS — BP 108/75 | HR 74 | Temp 98.0°F | Ht 67.0 in | Wt 149.6 lb

## 2023-04-01 DIAGNOSIS — D179 Benign lipomatous neoplasm, unspecified: Secondary | ICD-10-CM

## 2023-04-01 NOTE — Assessment & Plan Note (Signed)
No change in size since last time however has become a little bit more painful and pruritic.  He is possibly interested in surgical removal.  Will check ultrasound first and depending on results will refer to surgery for definitive management.

## 2023-04-01 NOTE — Patient Instructions (Signed)
It was very nice to see you today!  We will check an ultrasound of your leg to further evaluate.  We may refer you to have injections or excision performed depending on results of your ultrasound.   Take care, Dr Jimmey Ralph  PLEASE NOTE:  If you had any lab tests, please let us know if you have not heard back within a few days. You may see your results on mychart before we have a chance to review them but we will give you a call once they are reviewed by [place].   If we ordered any referrals today, please let us know if you have not heard from their office within the next week.   If you had any urgent prescriptions sent in today, please check with the pharmacy within an hour of our visit to make sure the prescription was transmitted appropriately.   Please try these tips to maintain a healthy lifestyle:  Eat at least 3 REAL meals and 1-2 snacks per day.  Aim for no more than 5 hours between eating.  If you eat breakfast, please do so within one hour of getting up.   Each meal should contain half fruits/vegetables, one quarter protein, and one quarter carbs (no bigger than a computer mouse)  Cut down on sweet beverages. This includes juice, soda, and sweet tea.   Drink at least 1 glass of water with each meal and aim for at least 8 glasses per day  Exercise at least 150 minutes every week.

## 2023-04-01 NOTE — Progress Notes (Signed)
   Cody [name] is a 42 y.o. male who presents today for an office visit.  Assessment/Plan:  Chronic Problems Addressed Today: Lipoma No change in size since last time however has become a little bit more painful and pruritic.  He is possibly interested in surgical removal.  Will check ultrasound first and depending on results will refer to surgery for definitive management.    Subjective:  HPI:   Patient here for follow-up.  We last saw him a few months ago.  We have been monitoring a couple of masses on his right outer thigh.  He has previously been diagnosed as a lipoma.  Since our last visit he has noticed that the areas have become a little more painful and itchy and he would like to have further evaluation.       Objective:  Physical Exam: BP 108/75   Pulse 74   Temp 98 F (36.7 C) (Temporal)   Ht 5\' 7"  (1.702 m)   Wt 149 lb 9.6 oz (67.9 kg)   SpO2 96%   BMI 23.43 kg/m   Gen: No acute distress, resting comfortably MSK: - Right Leg: 2 small discrete subtle masses approximately 1 cm in diameter on right lateral thigh Neuro: Grossly normal, moves all extremities Psych: Normal affect and thought content      Lareta Bruneau M. Jimmey Ralph, MD 04/01/2023 8:19 AM

## 2023-04-25 ENCOUNTER — Ambulatory Visit (HOSPITAL_BASED_OUTPATIENT_CLINIC_OR_DEPARTMENT_OTHER)
Admission: RE | Admit: 2023-04-25 | Discharge: 2023-04-25 | Disposition: A | Discharge status: Home / Self Care | Payer: 59 | Source: Ambulatory Visit | Attending: Family Medicine | Admitting: Family Medicine

## 2023-04-25 DIAGNOSIS — D179 Benign lipomatous neoplasm, unspecified: Secondary | ICD-10-CM | POA: Insufficient documentation

## 2023-04-28 NOTE — Progress Notes (Signed)
Ultrasound confirms that the lesions he has are lipomas.  These are benign however we can refer him to the see a surgeon to have them removed if he wishes.  Please place referral if needed.

## 2023-05-04 ENCOUNTER — Other Ambulatory Visit: Payer: Self-pay | Admitting: *Deleted

## 2023-05-04 DIAGNOSIS — D179 Benign lipomatous neoplasm, unspecified: Secondary | ICD-10-CM

## 2023-07-07 ENCOUNTER — Ambulatory Visit (INDEPENDENT_AMBULATORY_CARE_PROVIDER_SITE_OTHER): Payer: 59 | Admitting: Family Medicine

## 2023-07-07 VITALS — BP 118/82 | HR 84 | Temp 98.6°F | Ht 67.0 in | Wt 148.0 lb

## 2023-07-07 DIAGNOSIS — Z13220 Encounter for screening for lipoid disorders: Secondary | ICD-10-CM | POA: Diagnosis not present

## 2023-07-07 DIAGNOSIS — D179 Benign lipomatous neoplasm, unspecified: Secondary | ICD-10-CM | POA: Diagnosis not present

## 2023-07-07 DIAGNOSIS — Z0001 Encounter for general adult medical examination with abnormal findings: Secondary | ICD-10-CM | POA: Diagnosis not present

## 2023-07-07 DIAGNOSIS — Z131 Encounter for screening for diabetes mellitus: Secondary | ICD-10-CM

## 2023-07-07 LAB — COMPREHENSIVE METABOLIC PANEL
ALT: 17 U/L (ref 0–53)
AST: 15 U/L (ref 0–37)
Albumin: 4.2 g/dL (ref 3.5–5.2)
Alkaline Phosphatase: 60 U/L (ref 39–117)
BUN: 15 mg/dL (ref 6–23)
CO2: 30 mEq/L (ref 19–32)
Calcium: 9 mg/dL (ref 8.4–10.5)
Chloride: 104 mEq/L (ref 96–112)
Creatinine, Ser: 0.92 mg/dL (ref 0.40–1.50)
GFR: 101.86 mL/min (ref >60.00)
Glucose, Bld: 114 mg/dL — ABNORMAL HIGH (ref 70–99)
Potassium: 3.8 mEq/L (ref 3.5–5.1)
Sodium: 140 mEq/L (ref 135–145)
Total Bilirubin: 0.8 mg/dL (ref 0.2–1.2)
Total Protein: 6.6 g/dL (ref 6.0–8.3)

## 2023-07-07 LAB — LIPID PANEL
Cholesterol: 154 mg/dL (ref 0–200)
HDL: 48 mg/dL (ref >39.00)
LDL Cholesterol: 82 mg/dL (ref 0–99)
NonHDL: 105.83
Total CHOL/HDL Ratio: 3
Triglycerides: 120 mg/dL (ref 0.0–149.0)
VLDL: 24 mg/dL (ref 0.0–40.0)

## 2023-07-07 LAB — CBC
HCT: 43.5 % (ref 39.0–52.0)
Hemoglobin: 14.6 g/dL (ref 13.0–17.0)
MCHC: 33.4 g/dL (ref 30.0–36.0)
MCV: 87.7 fl (ref 78.0–100.0)
Platelets: 270 10*3/uL (ref 150.0–400.0)
RBC: 4.96 Mil/uL (ref 4.22–5.81)
RDW: 12.8 % (ref 11.5–15.5)
WBC: 6.1 10*3/uL (ref 4.0–10.5)

## 2023-07-07 LAB — TSH: TSH: 1.81 u[IU]/mL (ref 0.35–5.50)

## 2023-07-07 LAB — HEMOGLOBIN A1C: Hgb A1c MFr Bld: 5.4 % (ref 4.6–6.5)

## 2023-07-07 NOTE — Patient Instructions (Signed)
It was very nice to see you today!  We will check blood work today.  Please continue to work on diet and exercise.  Please call your surgeon's office at [phone] to discuss your lipomas.  Return in about 1 year (around 07/06/2024) for Annual Physical.   Take care, Dr Jimmey Ralph  PLEASE NOTE:  If you had any lab tests, please let us know if you have not heard back within a few days. You may see your results on mychart before we have a chance to review them but we will give you a call once they are reviewed by [place].   If we ordered any referrals today, please let us know if you have not heard from their office within the next week.   If you had any urgent prescriptions sent in today, please check with the pharmacy within an hour of our visit to make sure the prescription was transmitted appropriately.   Please try these tips to maintain a healthy lifestyle:  Eat at least 3 REAL meals and 1-2 snacks per day.  Aim for no more than 5 hours between eating.  If you eat breakfast, please do so within one hour of getting up.   Each meal should contain half fruits/vegetables, one quarter protein, and one quarter carbs (no bigger than a computer mouse)  Cut down on sweet beverages. This includes juice, soda, and sweet tea.   Drink at least 1 glass of water with each meal and aim for at least 8 glasses per day  Exercise at least 150 minutes every week.    Preventive Care 39-64 Years Old, Male Preventive care refers to lifestyle choices and visits with your health care provider that can promote health and wellness. Preventive care visits are also called wellness exams. What can I expect for my preventive care visit? Counseling During your preventive care visit, your health care provider may ask about your: Medical history, including: Past medical problems. Family medical history. Current health, including: Emotional well-being. Home life and relationship well-being. Sexual  activity. Lifestyle, including: Alcohol, nicotine or tobacco, and drug use. Access to firearms. Diet, exercise, and sleep habits. Safety issues such as seatbelt and bike helmet use. Sunscreen use. Work and work Astronomer. Physical exam Your health care provider will check your: Height and weight. These may be used to calculate your BMI (body mass index). BMI is a measurement that tells if you are at a healthy weight. Waist circumference. This measures the distance around your waistline. This measurement also tells if you are at a healthy weight and may help predict your risk of certain diseases, such as type 2 diabetes and high blood pressure. Heart rate and blood pressure. Body temperature. Skin for abnormal spots. What immunizations do I need?  Vaccines are usually given at various ages, according to a schedule. Your health care provider will recommend vaccines for you based on your age, medical history, and lifestyle or other factors, such as travel or where you work. What tests do I need? Screening Your health care provider may recommend screening tests for certain conditions. This may include: Lipid and cholesterol levels. Diabetes screening. This is done by checking your blood sugar (glucose) after you have not eaten for a while (fasting). Hepatitis B test. Hepatitis C test. HIV (human immunodeficiency virus) test. STI (sexually transmitted infection) testing, if you are at risk. Lung cancer screening. Prostate cancer screening. Colorectal cancer screening. Talk with your health care provider about your test results, treatment options, and if necessary,  the need for more tests. Follow these instructions at home: Eating and drinking  Eat a diet that includes fresh fruits and vegetables, whole grains, lean protein, and low-fat dairy products. Take vitamin and mineral supplements as recommended by your health care provider. Do not drink alcohol if your health care provider  tells you not to drink. If you drink alcohol: Limit how much you have to 0-2 drinks a day. Know how much alcohol is in your drink. In the [place] one drink equals one 12 oz bottle of beer (355 mL), one 5 oz glass of wine (148 mL), or one 1 oz glass of hard liquor (44 mL). Lifestyle Brush your teeth every morning and night with fluoride toothpaste. Floss one time each day. Exercise for at least 30 minutes 5 or more days each week. Do not use any products that contain nicotine or tobacco. These products include cigarettes, chewing tobacco, and vaping devices, such as e-cigarettes. If you need help quitting, ask your health care provider. Do not use drugs. If you are sexually active, practice safe sex. Use a condom or other form of protection to prevent STIs. Take aspirin only as told by your health care provider. Make sure that you understand how much to take and what form to take. Work with your health care provider to find out whether it is safe and beneficial for you to take aspirin daily. Find healthy ways to manage stress, such as: Meditation, yoga, or listening to music. Journaling. Talking to a trusted person. Spending time with friends and family. Minimize exposure to UV radiation to reduce your risk of skin cancer. Safety Always wear your seat belt while driving or riding in a vehicle. Do not drive: If you have been drinking alcohol. Do not ride with someone who has been drinking. When you are tired or distracted. While texting. If you have been using any mind-altering substances or drugs. Wear a helmet and other protective equipment during sports activities. If you have firearms in your house, make sure you follow all gun safety procedures. What's next? Go to your health care provider once a year for an annual wellness visit. Ask your health care provider how often you should have your eyes and teeth checked. Stay up to date on all vaccines. This information is not intended to  replace advice given to you by your health care provider. Make sure you discuss any questions you have with your health care provider. Document Revised: 06/10/2021 Document Reviewed: 06/10/2021 Elsevier Patient Education  2024 [employer].

## 2023-07-07 NOTE — Assessment & Plan Note (Signed)
No significant change since last time.  We did refer him to surgery however he never heard back from the referral.  We gave contact information today instructed him to call to schedule appointment soon.

## 2023-07-07 NOTE — Progress Notes (Signed)
Chief Complaint:  Cody [name] is a 42 y.o. male who presents today for his annual comprehensive physical exam.    Assessment/Plan:  Chronic Problems Addressed Today: Lipoma No significant change since last time.  We did refer him to surgery however he never heard back from the referral.  We gave contact information today instructed him to call to schedule appointment soon.  Preventative Healthcare: Up-to-date on vaccines.  Check labs.  Patient Counseling(The following topics were reviewed and/or handout was given):  -Nutrition: Stressed importance of moderation in sodium/caffeine intake, saturated fat and cholesterol, caloric balance, sufficient intake of fresh fruits, vegetables, and fiber.  -Stressed the importance of regular exercise.   -Substance Abuse: Discussed cessation/primary prevention of tobacco, alcohol, or other drug use; driving or other dangerous activities under the influence; availability of treatment for abuse.   -Injury prevention: Discussed safety belts, safety helmets, smoke detector, smoking near bedding or upholstery.   -Sexuality: Discussed sexually transmitted diseases, partner selection, use of condoms, avoidance of unintended pregnancy and contraceptive alternatives.   -Dental health: Discussed importance of regular tooth brushing, flossing, and dental visits.  -Health maintenance and immunizations reviewed. Please refer to Health maintenance section.  Return to care in 1 year for next preventative visit.     Subjective:  HPI:  He has no acute complaints today.   Lifestyle Diet: Balanced. Plenty of fruits and vegetables.  Exercise: Walking daily.      07/07/2023    8:36 AM  Depression screen PHQ 2/9  Decreased Interest 0  Down, Depressed, Hopeless 0  PHQ - 2 Score 0  Altered sleeping 0  Tired, decreased energy 0  Change in appetite 0  Feeling bad or failure about yourself  0  Trouble concentrating 0  Moving slowly or fidgety/restless 0   Suicidal thoughts 0  PHQ-9 Score 0  Difficult doing work/chores Not difficult at all    Health Maintenance Due  Topic Date Due   Hepatitis C Screening  Never done   COVID-19 Vaccine (4 - 2023-24 season) 08/27/2022     ROS: Per HPI, otherwise a complete review of systems was negative.   PMH:  The following were reviewed and entered/updated in epic: No past medical history on file. Patient Active Problem List   Diagnosis Date Noted   Lipoma 07/05/2022   No past surgical history on file.  Family History  Problem Relation Age of Onset   Cancer Mother     Medications- reviewed and updated No current outpatient medications on file.   No current facility-administered medications for this visit.    Allergies-reviewed and updated No Known Allergies  Social History   Socioeconomic History   Marital status: Married    Spouse name: Not on file   Number of children: Not on file   Years of education: Not on file   Highest education level: Not on file  Occupational History   Not on file  Tobacco Use   Smoking status: Never   Smokeless tobacco: Never  Substance and Sexual Activity   Alcohol use: Never   Drug use: Never   Sexual activity: Never  Other Topics Concern   Not on file  Social History Narrative   Not on file   Social Determinants of Health   Financial Resource Strain: Not on file  Food Insecurity: Not on file  Transportation Needs: Not on file  Physical Activity: Not on file  Stress: Not on file  Social Connections: Not on file  Objective:  Physical Exam: BP 118/82 (BP Location: Right Arm, Patient Position: Sitting, Cuff Size: Small)   Pulse 84   Temp 98.6 F (37 C) (Temporal)   Ht 5\' 7"  (1.702 m)   Wt 148 lb (67.1 kg)   SpO2 97%   BMI 23.18 kg/m   Body mass index is 23.18 kg/m. Wt Readings from Last 3 Encounters:  07/07/23 148 lb (67.1 kg)  04/01/23 149 lb 9.6 oz (67.9 kg)  10/22/22 144 lb 3.2 oz (65.4 kg)   Gen: NAD, resting  comfortably HEENT: TMs normal bilaterally. OP clear. No thyromegaly noted.  CV: RRR with no murmurs appreciated Pulm: NWOB, CTAB with no crackles, wheezes, or rhonchi GI: Normal bowel sounds present. Soft, Nontender, Nondistended. MSK: no edema, cyanosis, or clubbing noted Skin: warm, dry Neuro: CN2-12 grossly intact. Strength 5/5 in upper and lower extremities. Reflexes symmetric and intact bilaterally.  Psych: Normal affect and thought content     Jocee Kissick M. Jimmey Ralph, MD 07/07/2023 9:02 AM

## 2023-07-08 NOTE — Progress Notes (Signed)
Great news! Labs are all normal.  Do not need to make any changes to treatment plan at this time.  He should continue to work on diet and exercise and we can recheck everything in a year or so.

## 2023-09-01 ENCOUNTER — Ambulatory Visit (INDEPENDENT_AMBULATORY_CARE_PROVIDER_SITE_OTHER): Payer: 59 | Admitting: Family Medicine

## 2023-09-01 ENCOUNTER — Encounter: Payer: Self-pay | Admitting: Family Medicine

## 2023-09-01 VITALS — BP 106/74 | HR 89 | Temp 97.5°F | Ht 67.0 in | Wt 147.8 lb

## 2023-09-01 DIAGNOSIS — R059 Cough, unspecified: Secondary | ICD-10-CM | POA: Diagnosis not present

## 2023-09-01 LAB — POC COVID19 BINAXNOW: SARS Coronavirus 2 Ag: POSITIVE — AB

## 2023-09-01 MED ORDER — NIRMATRELVIR/RITONAVIR (PAXLOVID)TABLET: 3.0000 | ORAL_TABLET | Freq: Two times a day (BID) | ORAL | 0 refills | Status: AC

## 2023-09-01 NOTE — Progress Notes (Signed)
   Coel Panjwani is a 42 y.o. male who presents today for an office visit.  Assessment/Plan:  COVID In office COVID test positive.  No red flags.  Reassuring exam today.  Discussed with patient he is overall low risk for complications from COVID.  We will send in pocket prescription with instruction to not start unless symptoms fail to improve over the next 1 to 2 days.  He can continue over-the-counter meds.  Encouraged hydration.  We discussed reasons to return to care and seek emergent care.     Subjective:  HPI:  See Assessment / plan for status of chronic conditions.  Patient is here with 3 days of cough, congestion, and bodyaches. Getting a little better. No treatments tried. Some chills. No fevers. No chest pain or shortness of breath.        Objective:  Physical Exam: BP 106/74   Pulse 89   Temp (!) 97.5 F (36.4 C) (Temporal)   Ht 5\' 7"  (1.702 m)   Wt 147 lb 12.8 oz (67 kg)   SpO2 96%   BMI 23.15 kg/m   Gen: No acute distress, resting comfortably CV: Regular rate and rhythm with no murmurs appreciated Pulm: Normal work of breathing, clear to auscultation bilaterally with no crackles, wheezes, or rhonchi Neuro: Grossly normal, moves all extremities Psych: Normal affect and thought content      Serrena Linderman M. Jimmey Ralph, MD 09/01/2023 10:06 AM

## 2023-09-01 NOTE — Patient Instructions (Signed)
It was very nice to see you today!  You have COVID.  I will send in Paxlovid.  Please start this if not improving.  Make sure that you are getting plenty fluids and stay well-hydrated.  Return if symptoms worsen or fail to improve.   Take care, Dr Jimmey Ralph  PLEASE NOTE:  If you had any lab tests, please let us know if you have not heard back within a few days. You may see your results on mychart before we have a chance to review them but we will give you a call once they are reviewed by [place].   If we ordered any referrals today, please let us know if you have not heard from their office within the next week.   If you had any urgent prescriptions sent in today, please check with the pharmacy within an hour of our visit to make sure the prescription was transmitted appropriately.   Please try these tips to maintain a healthy lifestyle:  Eat at least 3 REAL meals and 1-2 snacks per day.  Aim for no more than 5 hours between eating.  If you eat breakfast, please do so within one hour of getting up.   Each meal should contain half fruits/vegetables, one quarter protein, and one quarter carbs (no bigger than a computer mouse)  Cut down on sweet beverages. This includes juice, soda, and sweet tea.   Drink at least 1 glass of water with each meal and aim for at least 8 glasses per day  Exercise at least 150 minutes every week.

## 2023-10-14 ENCOUNTER — Telehealth: Payer: Self-pay | Admitting: Family Medicine

## 2023-10-14 NOTE — Telephone Encounter (Signed)
FYI: This call has been transferred to triage nurse: the Triage Nurse. Once the result note has been entered staff can address the message at that time.  Patient called in with the following symptoms:  Red Word: Approx. 1 week ago, was feeling dizzy, then Patient fell - hit head close to right eye . States felt better until today. Right ear feels tender today. States did not checked for fall.   Please advise at Mobile [phone] (mobile)  Message is routed to Provider Pool.

## 2023-10-14 NOTE — Telephone Encounter (Signed)
Patient has OV on 10/19/2023

## 2023-10-14 NOTE — Telephone Encounter (Signed)
Patient was transferred back to PCP office. States final outcome was to be seen within 3 days. I offered pt an OV with another provider for Monday but pt preferred to see pcp. Pt has been scheduled for 10/23 @ 8:20 am per his schedule.      Patient Name First: Cathy Last: [place] Gender: Male DOB: 1981/06/04 Age: 42 Y 9 M 24 D Return Phone Number: [phone] (Primary), [phone] (Secondary) Address: City/ State/ Zip: [place]  [address] Client [facility], [facility] Provider Jacquiline Doe- MD Contact Type Call Who Is Calling Patient / Member / Family / Caregiver Call Type Triage / Clinical Relationship To Patient Self Return Phone Number [phone] (Primary) Chief Complaint Earache Reason for Call Symptomatic / Request for Health Information Initial Comment Caller states week ago felt dizzy, then fell and hit head near RT eye; as better but today RT ear is tender; did not seed medical attention after hitting head; Translation No Nurse Assessment Nurse: D'Heur Ezzard Standing, RN, Adrienne Date/Time (Eastern Time): 10/14/2023 10:54:27 AM Confirm and document reason for call. If symptomatic, describe symptoms. ---Caller states week ago felt dizzy and fell; he hit head near his right eye; today his right ear hurts. He denies fever. Does the patient have any new or worsening symptoms? ---Yes Will a triage be completed? ---Yes Related visit to physician within the last 2 weeks? ---No Does the PT have any chronic conditions? (i.e. diabetes, asthma, this includes High risk factors for pregnancy, etc.) ---No Is this a behavioral health or substance abuse call? ---No Guidelines Guideline Title Affirmed Question Affirmed Notes Nurse Date/Time (Eastern Time) Tinnitus Symptoms only or mainly in 1 ear (unilateral tinnitus) D'Heur Ezzard Standing, RN, Adrienne 10/14/2023 10:58:03 AM Disp. Time  Lamount Cohen Time) Disposition Final User 10/14/2023 10:59:59 AM SEE PCP WITHIN 3 DAYS Yes D'Heur Ezzard Standing, RN, Hansel Starling Final Disposition 10/14/2023 10:59:59 AM SEE PCP WITHIN 3 DAYS Yes D'Heur Ezzard Standing, RN, Hansel Starling Caller Disagree/Comply Comply Caller Understands Yes PreDisposition Call Doctor Care Advice Given Per Guideline SEE PCP WITHIN 3 DAYS: CALL BACK IF: * Earache occurs * You become worse CARE ADVICE given per Tinnitus (Adult) guideline. Comments User: Albin Fischer Date/Time Lamount Cohen Time): 10/14/2023 10:49:24 AM Larita Fife provided info; User: Hansel Starling, D'Heur Ezzard Standing, RN Date/Time Lamount Cohen Time): 10/14/2023 10:58:33 AM Caller has ear issues with right ear and he does have some hearing loss.

## 2023-10-19 ENCOUNTER — Encounter: Payer: Self-pay | Admitting: Family Medicine

## 2023-10-19 ENCOUNTER — Ambulatory Visit: Payer: 59 | Admitting: Family Medicine

## 2023-10-19 VITALS — BP 100/64 | HR 84 | Temp 97.7°F | Ht 67.0 in | Wt 145.6 lb

## 2023-10-19 DIAGNOSIS — I4891 Unspecified atrial fibrillation: Secondary | ICD-10-CM

## 2023-10-19 DIAGNOSIS — Z23 Encounter for immunization: Secondary | ICD-10-CM

## 2023-10-19 DIAGNOSIS — R42 Dizziness and giddiness: Secondary | ICD-10-CM

## 2023-10-19 HISTORY — DX: Unspecified atrial fibrillation: I48.91

## 2023-10-19 LAB — COMPREHENSIVE METABOLIC PANEL
ALT: 22 U/L (ref 0–53)
AST: 21 U/L (ref 0–37)
Albumin: 4.3 g/dL (ref 3.5–5.2)
Alkaline Phosphatase: 57 U/L (ref 39–117)
BUN: 16 mg/dL (ref 6–23)
CO2: 31 meq/L (ref 19–32)
Calcium: 9.3 mg/dL (ref 8.4–10.5)
Chloride: 103 meq/L (ref 96–112)
Creatinine, Ser: 0.98 mg/dL (ref 0.40–1.50)
GFR: 94.23 mL/min (ref >60.00)
Glucose, Bld: 101 mg/dL — ABNORMAL HIGH (ref 70–99)
Potassium: 3.7 meq/L (ref 3.5–5.1)
Sodium: 139 meq/L (ref 135–145)
Total Bilirubin: 1.1 mg/dL (ref 0.2–1.2)
Total Protein: 7.2 g/dL (ref 6.0–8.3)

## 2023-10-19 LAB — CBC
HCT: 48 % (ref 39.0–52.0)
Hemoglobin: 16 g/dL (ref 13.0–17.0)
MCHC: 33.4 g/dL (ref 30.0–36.0)
MCV: 87 fL (ref 78.0–100.0)
Platelets: 297 10*3/uL (ref 150.0–400.0)
RBC: 5.52 Mil/uL (ref 4.22–5.81)
RDW: 13 % (ref 11.5–15.5)
WBC: 6.5 10*3/uL (ref 4.0–10.5)

## 2023-10-19 LAB — HEMOGLOBIN A1C: Hgb A1c MFr Bld: 5.5 % (ref 4.6–6.5)

## 2023-10-19 LAB — TSH: TSH: 1.75 u[IU]/mL (ref 0.35–5.50)

## 2023-10-19 NOTE — Progress Notes (Signed)
   Cody [name] is a 42 y.o. male who presents today for an office visit.  Interpretor was offered however he declined.   Assessment/Plan:  New/Acute Problems: Dizziness  Likely due to his new onset atrial fibrillation.  He is hemodynamically stable today with stable blood pressure and ventricular rate.  He is not currently having any symptoms and has a reassuring neuroexam today.  Will be addressing his atrial fibrillation as below.    He had bilateral hearing loss on our hearing screening today as well as right-sided tinnitus.  It is possible that he could have other neurologic or vestibular pathology contributing, especially considering that description of dizziness was more of a vertigo-like, room spinning sensation.  Given his normal neurologic exam today do not think we need to obtain stat imaging or send him to the emergency department however this does need relatively quick workup.  Will place urgent referral to ENT.  He will let us know if he cannot see them in a timely manner and we would pursue MRI brain at that time.  We discussed reasons to return to care and seek emergent care.  Chronic Problems Addressed Today: Atrial fibrillation (HCC) New diagnosis.  He is currently asymptomatic and has reassuring vitals - does not need emergent work up or evaluation. His afib is likely the cause his episode of dizziness a couple of weeks ago however we are ruling out other etiologies as above.  His CHA2DS2-VASc were 0-does not need to start anticoagulation at this point.  Will check basic labs today including CBC, c-Met, TSH, and A1c.  Had lengthy discussion with patient regarding this diagnosis and pathophysiology.  He is agreeable to see cardiology for further evaluation and management.  Will place referral today.  We discussed reasons to return to care and seek emergent care.     Subjective:  HPI:  See Assessment / plan for status of chronic conditions.  He was looking at his phone when he  suddenly had a spinning sensation and fell to the floor. He hit his face on the floor. Symptoms lasted for 15-30 minutes and then resolved. No recurrences since then. He feels like he is now back to normal. He has noticed some hearing loss in his right ear.  Also describes a buzzing or clicking sensation in his right ear as well.  No loss of consciousness. No chest pain or palpitations.        Objective:  Physical Exam: BP 100/64   Pulse 84   Temp 97.7 F (36.5 C) (Temporal)   Ht 5\' 7"  (1.702 m)   Wt 145 lb 9.6 oz (66 kg)   SpO2 97%   BMI 22.80 kg/m   Gen: No acute distress, resting comfortably HEENT: TMs clear bilaterally. CV: Irregular no murmurs appreciated Pulm: Normal work of breathing, clear to auscultation bilaterally with no crackles, wheezes, or rhonchi Neuro: CN 2-12 intact. Finger-nose-finger testing intact bilaterally.  Strength 5 out of 5 in upper lower extremities.  Sensation light touch intact throughout. Psych: Normal affect and thought content  EKG: Atrial fibrillation. No ischemic changes.  Time Spent: 45 minutes of total time was spent on the date of the encounter performing the following actions: chart review prior to seeing the patient, obtaining history, performing a medically necessary exam, counseling on the treatment plan, placing orders, and documenting in our EHR. This does not include time spent interpreting his EKG.        Katina Degree. Jimmey Ralph, MD 10/19/2023 9:05 AM

## 2023-10-19 NOTE — Assessment & Plan Note (Addendum)
New diagnosis.  He is currently asymptomatic and has reassuring vitals - does not need emergent work up or evaluation. His afib is likely the cause his episode of dizziness a couple of weeks ago however we are ruling out other etiologies as above.  His CHA2DS2-VASc were 0-does not need to start anticoagulation at this point.  Will check basic labs today including CBC, c-Met, TSH, and A1c.  Had lengthy discussion with patient regarding this diagnosis and pathophysiology.  He is agreeable to see cardiology for further evaluation and management.  Will place referral today.  We discussed reasons to return to care and seek emergent care.

## 2023-10-19 NOTE — Patient Instructions (Addendum)
It was very nice to see you today!  We we will refer you to see the cardiology.  You have atrial fibrillation.  This is an abnormal heart rhythm that could cause your symptoms.  Please make sure that you are getting plenty fluids and staying well-hydrated.  Let us know if you have any recurrence of dizziness.  Let us know if you develop any chest pain, shortness of breath, or palpitations before you see cardiology.  Your dizziness may be coming from your inner ear as well.  I will refer you to see an ear doctor for further evaluation.  We will check blood work today.  Return if symptoms worsen or fail to improve.   Take care, Dr Jimmey Ralph  PLEASE NOTE:  If you had any lab tests, please let us know if you have not heard back within a few days. You may see your results on mychart before we have a chance to review them but we will give you a call once they are reviewed by [place].   If we ordered any referrals today, please let us know if you have not heard from their office within the next week.   If you had any urgent prescriptions sent in today, please check with the pharmacy within an hour of our visit to make sure the prescription was transmitted appropriately.   Please try these tips to maintain a healthy lifestyle:  Eat at least 3 REAL meals and 1-2 snacks per day.  Aim for no more than 5 hours between eating.  If you eat breakfast, please do so within one hour of getting up.   Each meal should contain half fruits/vegetables, one quarter protein, and one quarter carbs (no bigger than a computer mouse)  Cut down on sweet beverages. This includes juice, soda, and sweet tea.   Drink at least 1 glass of water with each meal and aim for at least 8 glasses per day  Exercise at least 150 minutes every week.

## 2023-10-24 NOTE — Progress Notes (Signed)
His labs are all normal.  We can recheck again at next office visit. Do not need to make any changes to his treatment plan at this time.  He needs to follow-up with cardiology as we discussed at his office visit.

## 2023-12-05 ENCOUNTER — Ambulatory Visit (INDEPENDENT_AMBULATORY_CARE_PROVIDER_SITE_OTHER): Payer: 59

## 2023-12-05 ENCOUNTER — Encounter: Payer: Self-pay | Admitting: Cardiovascular Disease

## 2023-12-05 ENCOUNTER — Ambulatory Visit: Payer: 59 | Attending: Cardiovascular Disease | Admitting: Cardiovascular Disease

## 2023-12-05 VITALS — BP 100/80 | HR 74 | Ht 67.0 in | Wt 148.4 lb

## 2023-12-05 DIAGNOSIS — H814 Vertigo of central origin: Secondary | ICD-10-CM | POA: Diagnosis not present

## 2023-12-05 DIAGNOSIS — I480 Paroxysmal atrial fibrillation: Secondary | ICD-10-CM

## 2023-12-05 DIAGNOSIS — I4891 Unspecified atrial fibrillation: Secondary | ICD-10-CM

## 2023-12-05 NOTE — Progress Notes (Signed)
Cardiology Office Note:  .   Date:  12/05/2023  ID:  Sheran Luz, DOB October 02, 1981, MRN [id number] PCP: Ardith Dark, MD  [facility] Providers Cardiologist:  None    History of Present Illness: .   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 Jimmey Ralph. The patient reported an episode of sudden fall while sitting on a chair, without loss of consciousness. The patient was engaged in a text conversation on his phone at the time of the incident. He also reported experiencing ear discomfort around the same time, which he speculated might have contributed to balance issues leading to the fall.  The patient was unaware of his irregular heart rhythm until it was diagnosed. He reported no noticeable heart palpitations or irregularities. He also reported experiencing abnormal breathing patterns during a bout of COVID-19, which occurred two to three months prior to the current consultation. The patient did not require hospitalization for COVID-19 and has no known history of heart disease.  The patient's parents are deceased; the father lived until 33 and the mother had a history of cancer. The patient has been a non-smoker and reported cessation of alcohol consumption after the atrial fibrillation diagnosis. He previously consumed a bottle of beer per month.  The patient reported no significant physical activity beyond daily walks, covering approximately one and a half miles in 30-40 minutes. He reported no shortness of breath during these walks or when running slowly. The patient has no known history of joint swelling or heart problems during childhood,  but does recall frequent sore throats.  The patient reported no current symptoms of atrial fibrillation and was unaware of the irregular rhythm until it was diagnosed. He expressed concern about the potential for stroke due to atrial fibrillation. The  patient consumes three to four cups of coffee daily and was advised to reduce caffeine intake.  Normal TSH on labs checked in July and October 2024.  Normal hemoglobin A1c and renal function parameters.  Low risk lipid profile (HDL 48, LDL 82, triglycerides 120).  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 He reported no current health problems and takes no medications. The patient expressed interest in understanding potential treatment options for atrial fibrillation, including medication and ablation.  ROS: The patient specifically denies any chest pain at rest or with exertion, dyspnea at rest or with exertion, orthopnea, paroxysmal nocturnal dyspnea, syncope, palpitations, focal neurological deficits, intermittent claudication, lower extremity edema, unexplained weight gain, cough, hemoptysis or wheezing.   Studies Reviewed: Marland Kitchen   EKG Interpretation Date/Time:  Monday December 05 2023 13:30:43 EST Ventricular Rate:  74 PR Interval:  168 QRS Duration:  86 QT Interval:  368 QTC Calculation: 408 R Axis:   67  Text Interpretation: Normal sinus rhythm Normal ECG No previous ECGs available Confirmed by Sharena Dibenedetto [phone]) on 12/05/2023 1:41:51 PM    Personally reviewed the tracing from 10/19/2023 which shows atrial fibrillation with controlled ventricular response. Risk Assessment/Calculations:    CHA2DS2-VASc Score = 0   This indicates a 0.2% annual risk of stroke. The patient's score is based upon: CHF History: 0 HTN History: 0 Diabetes History: 0 Stroke History: 0 Vascular Disease History: 0 Age Score: 0 Gender Score: 0             Physical Exam:   VS:  BP 100/80 (BP Location: Left  Arm, Patient Position: Sitting, Cuff Size: Normal)   Pulse 74   Ht 5\' 7"  (1.702 m)   Wt 148 lb 6.4 oz (67.3 kg)   SpO2 96%   BMI 23.24 kg/m    Wt Readings from Last 3 Encounters:  12/05/23 148 lb 6.4 oz (67.3 kg)  10/19/23 145 lb 9.6 oz (66 kg)   09/01/23 147 lb 12.8 oz (67 kg)    GEN: Well nourished, well developed in no acute distress NECK: No JVD; No carotid bruits CARDIAC: RRR, no murmurs, rubs, gallops RESPIRATORY:  Clear to auscultation without rales, wheezing or rhonchi  ABDOMEN: Soft, non-tender, non-distended EXTREMITIES:  No edema; No deformity   ASSESSMENT AND PLAN: .    Mr. Skokan has incidentally diagnosed atrial fibrillation on a tracing performed while asymptomatic, roughly 2 weeks following an episode of sudden dizziness which made him fall.  He was never aware of palpitations.  Roughly 1 month before the episode of dizziness he had an upper respiratory infection due to COVID-19.  He has no other cardiac symptoms and his physical exam is normal today.  He is in sinus rhythm and his ECG is completely normal.  It is unclear whether he had a one-time episode of atrial fibrillation, may be as part of postviral syndrome or whether he has more frequent episodes of asymptomatic paroxysmal atrial fibrillation.  At this point there is no reason to consider treatment with antiarrhythmics or ablation.  Even if he has recurrent episodes of paroxysmal arrhythmia, antiarrhythmic therapy would not offer much benefit since he is asymptomatic and he is spontaneously well rate controlled.  Recommend an echocardiogram to look for any signs of structural heart disease (consider rheumatic heart disease since he grew up in [place] and recalls having multiple episodes of pharyngitis) and a 2-week monitor to see if we can catch any episodes of asymptomatic arrhythmia.  It is more likely that his episode of dizziness was due to vertigo, since he also had some hearing problems around the same time, but there is a small chance that he has central vertigo due to a stroke.  If there is any evidence of a neurological event, that would drastically change our estimation of his future stroke risk, so I also ordered a CT of the head without  contrast.  Will have him follow-up in clinic once the results of all these tests are available.  Advised avoiding alcohol and moderating his intake of caffeine tomorrow more than 2 cups a day.       Dispo: Check echocardiogram, 2-week arrhythmia monitor, CT head without contrast.  Follow-up after tests performed.  Signed, Thurmon Fair, MD

## 2023-12-05 NOTE — Patient Instructions (Addendum)
Medication Instructions:  No changes *If you need a refill on your cardiac medications before your next appointment, please call your pharmacy*  Testing/Procedures: Your physician has requested that you have an echocardiogram. Echocardiography is a painless test that uses sound waves to create images of your heart. It provides your doctor with information about the size and shape of your heart and how well your heart's chambers and valves are working. This procedure takes approximately one hour. There are no restrictions for this procedure. Please do NOT wear cologne, perfume, aftershave, or lotions (deodorant is allowed). Please arrive 15 minutes prior to your appointment time.  Please note: We ask at that you not bring children with you during ultrasound (echo/ vascular) testing. Due to room size and safety concerns, children are not allowed in the ultrasound rooms during exams. Our front office staff cannot provide observation of children in our lobby area while testing is being conducted. An adult accompanying a patient to their appointment will only be allowed in the ultrasound room at the discretion of the ultrasound technician under special circumstances. We apologize for any inconvenience.    Head CT- will be performed at [facility] [address] [place], [place]. They will contact you to schedule this  Your physician has recommended that you wear a 14 DAY ZIO-PATCH monitor. The Zio patch cardiac monitor continuously records heart rhythm data for up to 14 days, this is for patients being evaluated for multiple types heart rhythms. For the first 24 hours post application, please avoid getting the Zio monitor wet in the shower or by excessive sweating during exercise. After that, feel free to carry on with regular activities. Keep soaps and lotions away from the ZIO XT Patch.  This will be mailed to you, please expect 7-10 days to receive.    Applying the monitor   Shave hair from  upper left chest.   Hold abrader disc by orange tab.  Rub abrader in 40 strokes over left upper chest as indicated in your monitor instructions.   Clean area with 4 enclosed alcohol pads .  Use all pads to assure are is cleaned thoroughly.  Let dry.   Apply patch as indicated in monitor instructions.  Patch will be place under collarbone on left side of chest with arrow pointing upward.   Rub patch adhesive wings for 2 minutes.Remove white label marked "1".  Remove white label marked "2".  Rub patch adhesive wings for 2 additional minutes.   While looking in a mirror, press and release button in center of patch.  A small green light will flash 3-4 times .  This will be your only indicator the monitor has been turned on.     Do not shower for the first 24 hours.  You may shower after the first 24 hours.   Press button if you feel a symptom. You will hear a small click.  Record Date, Time and Symptom in the Patient Log Book.   When you are ready to remove patch, follow instructions on last 2 pages of Patient Log Book.  Stick patch monitor onto last page of Patient Log Book.   Place Patient Log Book in [place] box.  Use locking tab on box and tape box closed securely.  The [employer] and [employer] has [employer] on it.  Please place in mailbox as soon as possible.  Your physician should have your test results approximately 7 days after the monitor has been mailed back to [facility].  Call [facility] Customer Care at [phone] if you have questions regarding your ZIO XT patch monitor.  Call them immediately if you see an orange light blinking on your monitor.   If your monitor falls off in less than 4 days contact our Monitor department at [phone].  If your monitor becomes loose or falls off after 4 days call [employer] at [phone] for suggestions on securing your monitor    Follow-Up: At [facility], you and your health needs are our priority.  As part of our  continuing mission to provide you with exceptional heart care, we have created designated Provider Care Teams.  These Care Teams include your primary Cardiologist (physician) and Advanced Practice Providers (APPs -  Physician Assistants and Nurse Practitioners) who all work together to provide you with the care you need, when you need it.  We recommend signing up for the patient portal called "MyChart".  Sign up information is provided on this After Visit Summary.  MyChart is used to connect with patients for Virtual Visits (Telemedicine).  Patients are able to view lab/test results, encounter notes, upcoming appointments, etc.  Non-urgent messages can be sent to your provider as well.   To learn more about what you can do with MyChart, go to [email or web address].    Your next appointment:   Thursday 01/19/24 at 0920

## 2023-12-05 NOTE — Progress Notes (Unsigned)
Enrolled for [employer] to mail a ZIO XT long term holter monitor to the patients address on file.  

## 2023-12-09 ENCOUNTER — Ambulatory Visit (HOSPITAL_COMMUNITY): Payer: 59 | Attending: Cardiovascular Disease

## 2023-12-09 DIAGNOSIS — I480 Paroxysmal atrial fibrillation: Secondary | ICD-10-CM | POA: Diagnosis present

## 2023-12-09 LAB — ECHOCARDIOGRAM COMPLETE: S' Lateral: 2.3 cm

## 2023-12-12 ENCOUNTER — Ambulatory Visit
Admission: RE | Admit: 2023-12-12 | Discharge: 2023-12-12 | Disposition: A | Discharge status: Home / Self Care | Payer: 59 | Source: Ambulatory Visit | Attending: Cardiovascular Disease | Admitting: Cardiovascular Disease

## 2023-12-12 DIAGNOSIS — H814 Vertigo of central origin: Secondary | ICD-10-CM

## 2023-12-14 DIAGNOSIS — I4891 Unspecified atrial fibrillation: Secondary | ICD-10-CM | POA: Diagnosis not present

## 2024-01-04 ENCOUNTER — Telehealth: Payer: Self-pay | Admitting: Cardiovascular Disease

## 2024-01-04 NOTE — Telephone Encounter (Signed)
 RN spoke to DOD Dr. Servando Salina  Provider recommends patient follow up with Dr. Royann Shivers at 01/19/24 OV   CHA2DS2-VASc Score = 0

## 2024-01-04 NOTE — Telephone Encounter (Signed)
 Call from [employer] Rapid afib Hr 190bpm lasting 60 sec Pg #13 strip #7 posted to zio web for viewing

## 2024-01-04 NOTE — Telephone Encounter (Signed)
 [employer] interpreter:  Maureen Ralphs [phone]  Attempted to call patient x2 with interpreter and x1 directly, no answer interpreter left message requesting he return call to office.

## 2024-01-05 NOTE — Telephone Encounter (Signed)
 Patient identification verified by 2 forms. Bertina Cooks, RN    [employer] Interpreter: [phone]  Called and spoke to patient  Relayed monitor results  Patient states:   -did not have palpitations while wearing monitor   -unsure if he was experiencing symptoms  Patient denies:   -Palpitations    -SOB/difficulty breathing   -chest tightness  Advised patient to keep 1/23 OV for follow up   Reviewed ED warning signs/precautions  Patient verbalized understanding, no questions at this time

## 2024-01-19 ENCOUNTER — Encounter: Payer: Self-pay | Admitting: Cardiovascular Disease

## 2024-01-19 ENCOUNTER — Ambulatory Visit: Payer: 59 | Attending: Cardiovascular Disease | Admitting: Cardiovascular Disease

## 2024-01-19 VITALS — BP 112/74 | HR 86 | Ht 67.0 in | Wt 150.8 lb

## 2024-01-19 DIAGNOSIS — I480 Paroxysmal atrial fibrillation: Secondary | ICD-10-CM

## 2024-01-19 MED ORDER — METOPROLOL SUCCINATE ER 25 MG PO TB24: 25.0000 mg | ORAL_TABLET | Freq: Every evening | ORAL | 3 refills | Status: DC

## 2024-01-19 NOTE — Patient Instructions (Signed)
Medication Instructions:  Metoprolol Succinate 25 mg every night at bedtime *If you need a refill on your cardiac medications before your next appointment, please call your pharmacy*  Testing/Procedures: Look into a [place] or a SmartWatch  You can look into the [employer] device by [employer]. This device is purchased by you and it connects to an application you download to your smart phone.  It can detect abnormal heart rhythms and alert you to contact your doctor for further evaluation. The web site is:  https://www.alivecor.[email or web address]     Follow-Up: At [facility], you and your health needs are our priority.  As part of our continuing mission to provide you with exceptional heart care, we have created designated Provider Care Teams.  These Care Teams include your primary Cardiologist (physician) and Advanced Practice Providers (APPs -  Physician Assistants and Nurse Practitioners) who all work together to provide you with the care you need, when you need it.  We recommend signing up for the patient portal called "MyChart".  Sign up information is provided on this After Visit Summary.  MyChart is used to connect with patients for Virtual Visits (Telemedicine).  Patients are able to view lab/test results, encounter notes, upcoming appointments, etc.  Non-urgent messages can be sent to your provider as well.   To learn more about what you can do with MyChart, go to [email or web address].    Your next appointment:   6 month(s)  Provider:   Dr Royann Shivers

## 2024-01-19 NOTE — Progress Notes (Signed)
Cardiology Office Note:  .   Date:  01/21/2024  ID:  Cody [name], DOB Dec 28, 1980, MRN [id number] PCP: Cody Dark, MD  [facility] Providers Cardiologist:  None    History of Present Illness: .   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  He had an episode of fall without loss of consciousness, that occurred during a period of vertigo in October 2024.  Workup in the PCP office however showed that he was in atrial fibrillation with controlled ventricular response.  He was unaware of the arrhythmia at the time.  On a subsequent arrhythmia monitor he had another episode of atrial fibrillation lasting about 24 hours, with significant RVR during activity, although with normal rates at rest and during sleep.  It did not appear to be preceded by atrial tachycardia or other triggering events.  That episode was likewise asymptomatic.  He remembers that he was at [facility] with his family that day.  His echocardiogram shows normal findings.  He does not have clinical symptoms of hyperthyroidism and had a normal TSH in October 2024.  He has a low risk cholesterol profile and normal glucose levels.  He does not have hypertension.  He does not have any symptoms of obstructive sleep apnea.  He works as a Higher education careers adviser.  He remains asymptomatic.  He continues to be physically active.  He exercises by taking daily walks of 1.5 miles an 30 minutes, partly jogging.  He has noticed that his heart rate reaches about 140 bpm following activity.  He has a smart watch that can detect his heart rate, but does not have ECG features.  The patient's parents are deceased; the father lived until 64 and the mother had a history of cancer. The patient has been a non-smoker. He previously consumed a bottle of beer per month.  He has avoided any alcohol since the diagnosis of atrial fibrillation.    Studies Reviewed: .        12/09/2023 echocardiogram   1. Left ventricular ejection fraction, by estimation, is 60 to 65%. The  left ventricle has normal function. The left ventricle has no regional  wall motion abnormalities. Left ventricular diastolic parameters are  indeterminate.   2. Right ventricular systolic function is normal. The right ventricular  size is normal. Tricuspid regurgitation signal is inadequate for assessing  PA pressure.   3. The mitral valve is normal in structure. Trivial mitral valve  regurgitation. No evidence of mitral stenosis.   4. The aortic valve is tricuspid. Aortic valve regurgitation is not  visualized. No aortic stenosis is present.   5. The inferior vena cava is normal in size with greater than 50%  respiratory variability, suggesting right atrial pressure of 3 mmHg.   6. The patient was in atrial fibrillation.   01/04/2024 arrhythmia monitor    The dominant rhythm is sinus rhythm with normal circadian variation.   There is a moderate burden of paroxysmal atrial fibrillation (9% of the recording period), made up by a single 24 hour event on 12/27-12/28. There is good  ventricular rate control at rest, but there is rapid ventricular response during the daytime, presumably with activity.   There is no significant ventricular arrhythmia or bradycardia.   Abnormal event monitor due to occasional paroxysmal atrial fibrillation with period of rapid ventricular rate.    Personally reviewed the tracing from 10/19/2023 which shows atrial fibrillation with controlled ventricular response. Risk Assessment/Calculations:  CHA2DS2-VASc Score = 0   This indicates a 0.2% annual risk of stroke. The patient's score is based upon: CHF History: 0 HTN History: 0 Diabetes History: 0 Stroke History: 0 Vascular Disease History: 0 Age Score: 0 Gender Score: 0          Physical Exam:   VS:  BP 112/74 (BP Location: Left Arm, Patient Position: Sitting, Cuff Size: Normal)   Pulse 86    Ht 5\' 7"  (1.702 m)   Wt 150 lb 12.8 oz (68.4 kg)   SpO2 95%   BMI 23.62 kg/m    Wt Readings from Last 3 Encounters:  01/19/24 150 lb 12.8 oz (68.4 kg)  12/05/23 148 lb 6.4 oz (67.3 kg)  10/19/23 145 lb 9.6 oz (66 kg)    GEN: Well nourished, well developed in no acute distress NECK: No JVD; No carotid bruits CARDIAC: RRR, no murmurs, rubs, gallops RESPIRATORY:  Clear to auscultation without rales, wheezing or rhonchi  ABDOMEN: Soft, non-tender, non-distended EXTREMITIES:  No edema; No deformity   ASSESSMENT AND PLAN: .    Afib: The arrhythmia is recurrent but completely asymptomatic.  There is no underlying structural abnormality to explain it.  He appears to have "lone A-fib".  Embolic risk is very low, CHA2DS2-VASc score is 0.  He does have RVR during activity so we will add a low-dose of beta-blocker today.  Encouraged him to get some type of electronic self-monitoring device with ECG capability such as a smart watch or Kardia.  Moderate caffeine intake.  Antiarrhythmics are not indicated at this time.  If the atrial fibrillation should become highly prevalent and/or symptomatic, would refer to EP to discuss ablation.        Patient Instructions  Medication Instructions:  Metoprolol Succinate 25 mg every night at bedtime *If you need a refill on your cardiac medications before your next appointment, please call your pharmacy*  Testing/Procedures: Look into a [place] or a SmartWatch  You can look into the [employer] device by [employer]. This device is purchased by you and it connects to an application you download to your smart phone.  It can detect abnormal heart rhythms and alert you to contact your doctor for further evaluation. The web site is:  https://www.alivecor.com     Follow-Up: At [facility], you and your health needs are our priority.  As part of our continuing mission to provide you with exceptional heart care, we have created designated Provider Care  Teams.  These Care Teams include your primary Cardiologist (physician) and Advanced Practice Providers (APPs -  Physician Assistants and Nurse Practitioners) who all work together to provide you with the care you need, when you need it.  We recommend signing up for the patient portal called "MyChart".  Sign up information is provided on this After Visit Summary.  MyChart is used to connect with patients for Virtual Visits (Telemedicine).  Patients are able to view lab/test results, encounter notes, upcoming appointments, etc.  Non-urgent messages can be sent to your provider as well.   To learn more about what you can do with MyChart, go to [email or web address].    Your next appointment:   6 month(s)  Provider:   Dr Royann Shivers         Signed, Thurmon Fair, MD

## 2024-06-25 ENCOUNTER — Encounter: Payer: Self-pay | Admitting: Family Medicine

## 2024-06-25 ENCOUNTER — Ambulatory Visit: Admitting: Family Medicine

## 2024-06-25 VITALS — BP 100/67 | HR 69 | Temp 98.1°F | Ht 67.0 in | Wt 145.8 lb

## 2024-06-25 DIAGNOSIS — R739 Hyperglycemia, unspecified: Secondary | ICD-10-CM

## 2024-06-25 DIAGNOSIS — I4891 Unspecified atrial fibrillation: Secondary | ICD-10-CM

## 2024-06-25 HISTORY — DX: Hyperglycemia, unspecified: R73.9

## 2024-06-25 NOTE — Assessment & Plan Note (Signed)
 Continue management per cardiology.  Currently on metoprolol  succinate 25 mg nightly.  Tolerating well.

## 2024-06-25 NOTE — Assessment & Plan Note (Signed)
 He has had a few elevated random glucoses over the last couple of years however all of his A1c's have been in the normal range.  Reassured patient that he is likely not diabetic.  We did discuss rechecking A1c today however he would like to hold off until he comes back in for his physical in a few weeks.  He will let us  know if he develops any signs or symptoms of diabetes in the meantime.

## 2024-06-25 NOTE — Patient Instructions (Signed)
 It was very nice to see you today!  We can check blood work when you come back in however your last several years of labs did not indicate diabetes.  Please let us  know if you develop any symptoms such as frequent urination.  Please follow-up with cardiology soon to follow-up on your A-fib.  Please try to get at least 30 minutes of cardiovascular exercise daily.  Return for Annual Physical.   Take care, Dr Kennyth  PLEASE NOTE:  If you had any lab tests, please let us  know if you have not heard back within a few days. You may see your results on mychart before we have a chance to review them but we will give you a call once they are reviewed by us .   If we ordered any referrals today, please let us  know if you have not heard from their office within the next week.   If you had any urgent prescriptions sent in today, please check with the pharmacy within an hour of our visit to make sure the prescription was transmitted appropriately.   Please try these tips to maintain a healthy lifestyle:  Eat at least 3 REAL meals and 1-2 snacks per day.  Aim for no more than 5 hours between eating.  If you eat breakfast, please do so within one hour of getting up.   Each meal should contain half fruits/vegetables, one quarter protein, and one quarter carbs (no bigger than a computer mouse)  Cut down on sweet beverages. This includes juice, soda, and sweet tea.   Drink at least 1 glass of water with each meal and aim for at least 8 glasses per day  Exercise at least 150 minutes every week.

## 2024-06-25 NOTE — Progress Notes (Signed)
   Cody [name] is a 43 y.o. male who presents today for an office visit.  Assessment/Plan:  Chronic Problems Addressed Today: Hyperglycemia He has had a few elevated random glucoses over the last couple of years however all of his A1c's have been in the normal range.  Reassured patient that he is likely not diabetic.  We did discuss rechecking A1c today however he would like to hold off until he comes back in for his physical in a few weeks.  He will let us  know if he develops any signs or symptoms of diabetes in the meantime.  Atrial fibrillation (HCC) Continue management per cardiology.  Currently on metoprolol  succinate 25 mg nightly.  Tolerating well.     Subjective:  HPI:  See Assessment / plan for status of chronic conditions.  Overall doing well today.  He is interested in being checked for diabetes as his wife was recently diagnosed with this.  Does have some increased thirst recently but no polyuria.       Objective:  Physical Exam: BP 100/67   Pulse 69   Temp 98.1 F (36.7 C) (Temporal)   Ht 5' 7 (1.702 m)   Wt 145 lb 12.8 oz (66.1 kg)   SpO2 96%   BMI 22.84 kg/m   Gen: No acute distress, resting comfortably Neuro: Grossly normal, moves all extremities Psych: Normal affect and thought content      Kirsi Hugh M. Kennyth, MD 06/25/2024 10:43 AM

## 2024-07-09 ENCOUNTER — Encounter: Payer: Self-pay | Admitting: Family Medicine

## 2024-07-09 ENCOUNTER — Ambulatory Visit (INDEPENDENT_AMBULATORY_CARE_PROVIDER_SITE_OTHER): Admitting: Family Medicine

## 2024-07-09 ENCOUNTER — Ambulatory Visit: Payer: Self-pay | Admitting: Family Medicine

## 2024-07-09 VITALS — BP 107/73 | HR 62 | Temp 97.7°F | Ht 67.0 in | Wt 146.0 lb

## 2024-07-09 DIAGNOSIS — G4730 Sleep apnea, unspecified: Secondary | ICD-10-CM | POA: Insufficient documentation

## 2024-07-09 DIAGNOSIS — G4700 Insomnia, unspecified: Secondary | ICD-10-CM | POA: Insufficient documentation

## 2024-07-09 DIAGNOSIS — Z0001 Encounter for general adult medical examination with abnormal findings: Secondary | ICD-10-CM | POA: Diagnosis not present

## 2024-07-09 DIAGNOSIS — R739 Hyperglycemia, unspecified: Secondary | ICD-10-CM

## 2024-07-09 DIAGNOSIS — I4891 Unspecified atrial fibrillation: Secondary | ICD-10-CM

## 2024-07-09 HISTORY — DX: Sleep apnea, unspecified: G47.30

## 2024-07-09 HISTORY — DX: Insomnia, unspecified: G47.00

## 2024-07-09 LAB — LIPID PANEL
Cholesterol: 160 mg/dL (ref 0–200)
HDL: 48.3 mg/dL (ref >39.00)
LDL Cholesterol: 93 mg/dL (ref 0–99)
NonHDL: 111.34
Total CHOL/HDL Ratio: 3
Triglycerides: 92 mg/dL (ref 0.0–149.0)
VLDL: 18.4 mg/dL (ref 0.0–40.0)

## 2024-07-09 LAB — COMPREHENSIVE METABOLIC PANEL WITH GFR
ALT: 16 U/L (ref 0–53)
AST: 18 U/L (ref 0–37)
Albumin: 4.2 g/dL (ref 3.5–5.2)
Alkaline Phosphatase: 46 U/L (ref 39–117)
BUN: 13 mg/dL (ref 6–23)
CO2: 30 meq/L (ref 19–32)
Calcium: 9 mg/dL (ref 8.4–10.5)
Chloride: 102 meq/L (ref 96–112)
Creatinine, Ser: 0.99 mg/dL (ref 0.40–1.50)
GFR: 92.62 mL/min (ref >60.00)
Glucose, Bld: 97 mg/dL (ref 70–99)
Potassium: 3.5 meq/L (ref 3.5–5.1)
Sodium: 139 meq/L (ref 135–145)
Total Bilirubin: 1.1 mg/dL (ref 0.2–1.2)
Total Protein: 6.6 g/dL (ref 6.0–8.3)

## 2024-07-09 LAB — CBC
HCT: 42.8 % (ref 39.0–52.0)
Hemoglobin: 14.5 g/dL (ref 13.0–17.0)
MCHC: 33.8 g/dL (ref 30.0–36.0)
MCV: 86.6 fl (ref 78.0–100.0)
Platelets: 276 K/uL (ref 150.0–400.0)
RBC: 4.95 Mil/uL (ref 4.22–5.81)
RDW: 12.9 % (ref 11.5–15.5)
WBC: 4.7 K/uL (ref 4.0–10.5)

## 2024-07-09 LAB — HEMOGLOBIN A1C: Hgb A1c MFr Bld: 5.6 % (ref 4.6–6.5)

## 2024-07-09 LAB — TSH: TSH: 1.44 u[IU]/mL (ref 0.35–5.50)

## 2024-07-09 NOTE — Patient Instructions (Signed)
 It was very nice to see you today!  We will check blood work.  Please continue to work on diet and exercise.  Please let me know if you like referral for a sleep study.  You are due for colon cancer screening next year.  I will see you back in year for your next physical.  Come back sooner if needed.  Return in about 1 year (around 07/09/2025) for Annual Physical.   Take care, Dr Kennyth  PLEASE NOTE:  If you had any lab tests, please let us  know if you have not heard back within a few days. You may see your results on mychart before we have a chance to review them but we will give you a call once they are reviewed by us .   If we ordered any referrals today, please let us  know if you have not heard from their office within the next week.   If you had any urgent prescriptions sent in today, please check with the pharmacy within an hour of our visit to make sure the prescription was transmitted appropriately.   Please try these tips to maintain a healthy lifestyle:  Eat at least 3 REAL meals and 1-2 snacks per day.  Aim for no more than 5 hours between eating.  If you eat breakfast, please do so within one hour of getting up.   Each meal should contain half fruits/vegetables, one quarter protein, and one quarter carbs (no bigger than a computer mouse)  Cut down on sweet beverages. This includes juice, soda, and sweet tea.   Drink at least 1 glass of water with each meal and aim for at least 8 glasses per day  Exercise at least 150 minutes every week.    Preventive Care 25-38 Years Old, Male Preventive care refers to lifestyle choices and visits with your health care provider that can promote health and wellness. Preventive care visits are also called wellness exams. What can I expect for my preventive care visit? Counseling During your preventive care visit, your health care provider may ask about your: Medical history, including: Past medical problems. Family medical  history. Current health, including: Emotional well-being. Home life and relationship well-being. Sexual activity. Lifestyle, including: Alcohol, nicotine or tobacco, and drug use. Access to firearms. Diet, exercise, and sleep habits. Safety issues such as seatbelt and bike helmet use. Sunscreen use. Work and work Astronomer. Physical exam Your health care provider will check your: Height and weight. These may be used to calculate your BMI (body mass index). BMI is a measurement that tells if you are at a healthy weight. Waist circumference. This measures the distance around your waistline. This measurement also tells if you are at a healthy weight and may help predict your risk of certain diseases, such as type 2 diabetes and high blood pressure. Heart rate and blood pressure. Body temperature. Skin for abnormal spots. What immunizations do I need?  Vaccines are usually given at various ages, according to a schedule. Your health care provider will recommend vaccines for you based on your age, medical history, and lifestyle or other factors, such as travel or where you work. What tests do I need? Screening Your health care provider may recommend screening tests for certain conditions. This may include: Lipid and cholesterol levels. Diabetes screening. This is done by checking your blood sugar (glucose) after you have not eaten for a while (fasting). Hepatitis B test. Hepatitis C test. HIV (human immunodeficiency virus) test. STI (sexually transmitted infection) testing, if  you are at risk. Lung cancer screening. Prostate cancer screening. Colorectal cancer screening. Talk with your health care provider about your test results, treatment options, and if necessary, the need for more tests. Follow these instructions at home: Eating and drinking  Eat a diet that includes fresh fruits and vegetables, whole grains, lean protein, and low-fat dairy products. Take vitamin and mineral  supplements as recommended by your health care provider. Do not drink alcohol if your health care provider tells you not to drink. If you drink alcohol: Limit how much you have to 0-2 drinks a day. Know how much alcohol is in your drink. In the [place] one drink equals one 12 oz bottle of beer (355 mL), one 5 oz glass of wine (148 mL), or one 1 oz glass of hard liquor (44 mL). Lifestyle Brush your teeth every morning and night with fluoride toothpaste. Floss one time each day. Exercise for at least 30 minutes 5 or more days each week. Do not use any products that contain nicotine or tobacco. These products include cigarettes, chewing tobacco, and vaping devices, such as e-cigarettes. If you need help quitting, ask your health care provider. Do not use drugs. If you are sexually active, practice safe sex. Use a condom or other form of protection to prevent STIs. Take aspirin only as told by your health care provider. Make sure that you understand how much to take and what form to take. Work with your health care provider to find out whether it is safe and beneficial for you to take aspirin daily. Find healthy ways to manage stress, such as: Meditation, yoga, or listening to music. Journaling. Talking to a trusted person. Spending time with friends and family. Minimize exposure to UV radiation to reduce your risk of skin cancer. Safety Always wear your seat belt while driving or riding in a vehicle. Do not drive: If you have been drinking alcohol. Do not ride with someone who has been drinking. When you are tired or distracted. While texting. If you have been using any mind-altering substances or drugs. Wear a helmet and other protective equipment during sports activities. If you have firearms in your house, make sure you follow all gun safety procedures. What's next? Go to your health care provider once a year for an annual wellness visit. Ask your health care provider how often you should  have your eyes and teeth checked. Stay up to date on all vaccines. This information is not intended to replace advice given to you by your health care provider. Make sure you discuss any questions you have with your health care provider. Document Revised: 06/10/2021 Document Reviewed: 06/10/2021 Elsevier Patient Education  2024 [employer]   Sleep Apnea Test: What to Expect  Sleep apnea is a condition that affects your breathing while you're sleeping. You may have shallow breathing or stop breathing for short periods of time. Sleep apnea screening is a test to check if you're at risk for sleep apnea. The test includes questions. It will only takes a few minutes. Your health care provider may ask you to have this test before a surgery or as part of a physical exam. What are the symptoms of sleep apnea? Snoring. Waking up often at night. Daytime sleepiness. Pauses in breathing. Choking or gasping during sleep. Being annoyed easily. Forgetfulness. Trouble thinking clearly. Depression. Personality changes. Headaches in the morning. Most people with sleep apnea do not know that they have it. What are the advantages of sleep apnea screening? Getting  screened for sleep apnea can help: Keep you safer. Your providers need to know whether or not you have sleep apnea, especially if you're having surgery or have other long-term, or chronic, health conditions. Improve your health and help you get a better night's rest. Restful sleep can help you: Have more energy. Lose weight. Improve high blood pressure. Improve diabetes management. Prevent stroke. Prevent car accidents. What happens before the screening? You may talk with your provider about the screening and what other tests may be recommended based on the screening. What happens during the screening? Screening usually includes being asked a list of questions about your sleep quality. Some questions you may be asked include: Do you  snore? Is your sleep restless? Do you have daytime sleepiness? Has a partner or spouse told you that you stop breathing, choke, or gasp during sleep? Have you had trouble concentrating or memory loss? What is your age? What is your neck circumference? To measure your neck, keep your back straight and gently wrap the tape measure around your neck. Put the tape measure at the middle of your neck, between your chin and collarbone. What is your sex assigned at birth? Do you have high blood pressure or are you being treated for high blood pressure? If your screening test is positive, you're at risk for the condition. More tests may be needed to confirm a diagnosis of sleep apnea. What can I expect after the screening? Your provider will go over the results of the screening with you and make recommendations based on the results of the test. Where to find more information You can find screening tools online or at your health care clinic. To learn more, go to these websites: Centers for Disease Control and Prevention: DiningCalendar.de. Then: Click Health Topics A-Z. Type sleep apnea in the search box. National Heart, Lung, and Blood Institute: [employer]DryCleaner.gl Contact a health care provider if: You think that you may have sleep apnea. This information is not intended to replace advice given to you by your health care provider. Make sure you discuss any questions you have with your health care provider. Document Revised: 05/21/2023 Document Reviewed: 05/21/2023 Elsevier Patient Education  2024 [employer].

## 2024-07-09 NOTE — Progress Notes (Signed)
 Chief Complaint:  Cody [name] is a 43 y.o. male who presents today for his annual comprehensive physical exam.    Assessment/Plan:  Chronic Problems Addressed Today: Hyperglycemia Check A1c.  He is working on lifestyle interventions.  Atrial fibrillation (HCC) No longer on metoprolol .  Will take this off med list.  Currently regular rate and rhythm on today's exam.  Advised him to follow-up with cardiology soon.  Sleep-disordered breathing We discussed referral for sleep study however he would like to hold off on this for now.  He will let us  know if he changes his mind.  Insomnia No red flags.  We discussed conservative measures including sleep hygiene.  It is okay for him to take over-the-counter melatonin or Benadryl/Unisom if needed.   Preventative Healthcare: Check labs.  Due for colonoscopy next year.  Patient Counseling(The following topics were reviewed and/or handout was given):  -Nutrition: Stressed importance of moderation in sodium/caffeine intake, saturated fat and cholesterol, caloric balance, sufficient intake of fresh fruits, vegetables, and fiber.  -Stressed the importance of regular exercise.   -Substance Abuse: Discussed cessation/primary prevention of tobacco, alcohol, or other drug use; driving or other dangerous activities under the influence; availability of treatment for abuse.   -Injury prevention: Discussed safety belts, safety helmets, smoke detector, smoking near bedding or upholstery.   -Sexuality: Discussed sexually transmitted diseases, partner selection, use of condoms, avoidance of unintended pregnancy and contraceptive alternatives.   -Dental health: Discussed importance of regular tooth brushing, flossing, and dental visits.  -Health maintenance and immunizations reviewed. Please refer to Health maintenance section.  Return to care in 1 year for next preventative visit.     Subjective:  HPI:  See A/P for status of chronic conditions.  Patient  is here today for his annual physical.  Overall is doing well.  He did stop taking his metoprolol  several months ago due to concern that it was causing excessive fatigue.  He has had intermittent palpitations over the last few months but this has been mostly manageable.  He does admit to having more difficulty with sleeping recently.  About difficulty falling asleep some nights.  He also does occasionally snore.  Lifestyle Diet: Balanced. Plenty of fruits and vegetables.  Exercise: Walking routinely.      07/09/2024    7:24 AM  Depression screen PHQ 2/9  Decreased Interest 0  Down, Depressed, Hopeless 0  PHQ - 2 Score 0    There are no preventive care reminders to display for this patient.   ROS: Per HPI, otherwise a complete review of systems was negative.   PMH:  The following were reviewed and entered/updated in epic: History reviewed. No pertinent past medical history. Patient Active Problem List   Diagnosis Date Noted   Insomnia 07/09/2024   Sleep-disordered breathing 07/09/2024   Hyperglycemia 06/25/2024   Atrial fibrillation (HCC) 10/19/2023   Lipoma 07/05/2022   History reviewed. No pertinent surgical history.  Family History  Problem Relation Age of Onset   Cancer Mother     Medications- reviewed and updated No current outpatient medications on file.   No current facility-administered medications for this visit.    Allergies-reviewed and updated No Known Allergies  Social History   Socioeconomic History   Marital status: Married    Spouse name: Not on file   Number of children: Not on file   Years of education: Not on file   Highest education level: Not on file  Occupational History   Not on file  Tobacco  Use   Smoking status: Never   Smokeless tobacco: Never  Substance and Sexual Activity   Alcohol use: Never   Drug use: Never   Sexual activity: Never  Other Topics Concern   Not on file  Social History Narrative   Not on file   Social  Drivers of Health   Financial Resource Strain: Not on file  Food Insecurity: Not on file  Transportation Needs: Not on file  Physical Activity: Not on file  Stress: Not on file  Social Connections: Not on file        Objective:  Physical Exam: BP 107/73   Pulse 62   Temp 97.7 F (36.5 C) (Temporal)   Ht 5' 7 (1.702 m)   Wt 146 lb (66.2 kg)   SpO2 96%   BMI 22.87 kg/m   Body mass index is 22.87 kg/m. Wt Readings from Last 3 Encounters:  07/09/24 146 lb (66.2 kg)  06/25/24 145 lb 12.8 oz (66.1 kg)  01/19/24 150 lb 12.8 oz (68.4 kg)   Gen: NAD, resting comfortably HEENT: TMs normal bilaterally. OP clear. No thyromegaly noted.  CV: RRR with no murmurs appreciated Pulm: NWOB, CTAB with no crackles, wheezes, or rhonchi GI: Normal bowel sounds present. Soft, Nontender, Nondistended. MSK: no edema, cyanosis, or clubbing noted Skin: warm, dry Neuro: CN2-12 grossly intact. Strength 5/5 in upper and lower extremities. Reflexes symmetric and intact bilaterally.  Psych: Normal affect and thought content     Shatasha Lambing M. Kennyth, MD 07/09/2024 7:46 AM

## 2024-07-09 NOTE — Telephone Encounter (Signed)
**Note De-identified  [place] Obfuscation** Please advise 

## 2024-07-09 NOTE — Assessment & Plan Note (Signed)
 No longer on metoprolol .  Will take this off med list.  Currently regular rate and rhythm on today's exam.  Advised him to follow-up with cardiology soon.

## 2024-07-09 NOTE — Progress Notes (Signed)
 Great news! Labs are all at goal. Do not need to make any changes to treatment plan. He should continue to work on diet and exercise and we can recheck everything in a year.

## 2024-07-09 NOTE — Assessment & Plan Note (Signed)
 Check A1c.  He is working on lifestyle interventions.

## 2024-07-09 NOTE — Assessment & Plan Note (Signed)
 No red flags.  We discussed conservative measures including sleep hygiene.  It is okay for him to take over-the-counter melatonin or Benadryl/Unisom if needed.

## 2024-07-09 NOTE — Assessment & Plan Note (Signed)
 We discussed referral for sleep study however he would like to hold off on this for now.  He will let us  know if he changes his mind.

## 2024-07-10 NOTE — Telephone Encounter (Signed)
 We did not check this with his labs as his A1c was within the normal range but we can add it on if he wishes.  Worth HERO. Kennyth, MD 07/10/2024 1:44 PM

## 2024-08-07 ENCOUNTER — Encounter: Admitting: Family Medicine

## 2024-08-15 NOTE — Progress Notes (Unsigned)
 Cardiology Office Note    Date:  08/17/2024  ID:  Cody [name], DOB [birth year], MRN [id number] PCP:  Cody [name] HERO, MD  Cardiologist:  None  Electrophysiologist:  None   Chief Complaint: Palpitations   History of Present Illness: .    Cody [name] is a 43 y.o. male with visit-pertinent history of paroxysmal atrial fibrillation.  Patient had an episode of fall without loss of consciousness that occurred during an episode of vertigo in October 2024.  Workup in his PCPs office however showed that he was in atrial fibrillation with controlled ventricular response, patient was unaware of arrhythmia at the time.  On a subsequent rhythm monitor he had another episode of atrial fibrillation lasting 24 hours with significant RVR during activity although with normal rates at rest and during his sleep.  It did not appear to be preceded by atrial tachycardia or other triggering events.  Patient's episode of atrial fibrillation was again asymptomatic.  Echocardiogram on 12/09/2023 indicated LVEF 60 to 65%, no RWMA, diastolic parameters were indeterminate, RV systolic function and size was normal, RV systolic function and size was normal, trivial mitral valve regurgitation with no evidence of stenosis, aortic valve regurgitation was not visualized, no evidence of stenosis.  Patient was noted to be in atrial fibrillation during echo.  Patient was last in clinic on 01/19/2024 by Dr. Francyne, patient remained asymptomatic and continue to be physically active.  He was exercising by walking 1-1/2 miles in 30 minutes daily.  Patient was started on metoprolol  succinate nightly.  Today he presents for follow-up.  He reports that he has been having episodes of palpitations once a week, typically lasting for 24 hours. He notes when he feels he is in atrial fibrillation he has increased palpitations and fatigue. He denies chest pain, shortness of breath, lower extremity edema, orthopnea or pnd.  He denies any dizziness,  lightheadedness, presyncope or syncope.  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  Patient reports that he tried to take metoprolol  succinate for 3 days following last office visit however reported significant fatigue and was unable to tolerate the medication.  ROS: .   Today he denies chest pain, shortness of breath, lower extremity edema, melena, hematuria, hemoptysis, diaphoresis, weakness, presyncope, syncope, orthopnea, and PND.  All other systems are reviewed and otherwise negative. Studies Reviewed: Cody [name]   EKG:  EKG is ordered today, personally reviewed, demonstrating  EKG Interpretation Date/Time:  Friday August 17 2024 08:27:01 EDT Ventricular Rate:  66 PR Interval:  168 QRS Duration:  68 QT Interval:  372 QTC Calculation: 389 R Axis:   68  Text Interpretation: Normal sinus rhythm When compared with ECG of 05-Dec-2023 13:30, No significant change was found Confirmed by Cody [name] [id number] on 08/17/2024 8:34:31 AM   CV Studies: Cardiac studies reviewed are outlined and summarized above. Otherwise please see EMR for full report. Cardiac Studies & Procedures   ______________________________________________________________________________________________     ECHOCARDIOGRAM  ECHOCARDIOGRAM COMPLETE 12/09/2023  Narrative ECHOCARDIOGRAM REPORT    Patient Name:   Cody [name]    Date of Exam: 12/09/2023 Medical Rec #:  [id number]     Height:       67.0 in Accession #:    [id number]    Weight:       148.4 lb Date of Birth:  04-24-1981    BSA:          1.781 m Patient Age:    41 years      BP:  100/80 mmHg Patient Gender: M             HR:           109 bpm. Exam Location:  [address]  Procedure: 2D Echo, Cardiac Doppler and Color Doppler  Indications:     I48.0 Paroxysmal atrial fibrillation  History:         Patient has no prior history of Echocardiogram examinations. Dizziness.  Sonographer:     Cody [name] RCS Referring Phys:  [facility] Diagnosing  Phys: Cody McleanMD  IMPRESSIONS   1. Left ventricular ejection fraction, by estimation, is 60 to 65%. The left ventricle has normal function. The left ventricle has no regional wall motion abnormalities. Left ventricular diastolic parameters are indeterminate. 2. Right ventricular systolic function is normal. The right ventricular size is normal. Tricuspid regurgitation signal is inadequate for assessing PA pressure. 3. The mitral valve is normal in structure. Trivial mitral valve regurgitation. No evidence of mitral stenosis. 4. The aortic valve is tricuspid. Aortic valve regurgitation is not visualized. No aortic stenosis is present. 5. The inferior vena cava is normal in size with greater than 50% respiratory variability, suggesting right atrial pressure of 3 mmHg. 6. The patient was in atrial fibrillation.  FINDINGS Left Ventricle: Left ventricular ejection fraction, by estimation, is 60 to 65%. The left ventricle has normal function. The left ventricle has no regional wall motion abnormalities. The left ventricular internal cavity size was normal in size. There is no left ventricular hypertrophy. Left ventricular diastolic parameters are indeterminate.  Right Ventricle: The right ventricular size is normal. No increase in right ventricular wall thickness. Right ventricular systolic function is normal. Tricuspid regurgitation signal is inadequate for assessing PA pressure.  Left Atrium: Left atrial size was normal in size.  Right Atrium: Right atrial size was normal in size.  Pericardium: There is no evidence of pericardial effusion.  Mitral Valve: The mitral valve is normal in structure. Trivial mitral valve regurgitation. No evidence of mitral valve stenosis.  Tricuspid Valve: The tricuspid valve is normal in structure. Tricuspid valve regurgitation is not demonstrated.  Aortic Valve: The aortic valve is tricuspid. Aortic valve regurgitation is not visualized. No aortic stenosis  is present.  Pulmonic Valve: The pulmonic valve was normal in structure. Pulmonic valve regurgitation is trivial.  Aorta: The aortic root is normal in size and structure.  Venous: The inferior vena cava is normal in size with greater than 50% respiratory variability, suggesting right atrial pressure of 3 mmHg.  IAS/Shunts: No atrial level shunt detected by color flow Doppler.   LEFT VENTRICLE PLAX 2D LVIDd:         4.20 cm LVIDs:         2.30 cm LV PW:         0.90 cm LV IVS:        0.90 cm LVOT diam:     2.00 cm LV SV:         45 LV SV Index:   25 LVOT Area:     3.14 cm   RIGHT VENTRICLE RV Basal diam:  2.00 cm RV S prime:     12.57 cm/s TAPSE (M-mode): 2.4 cm  LEFT ATRIUM             Index        RIGHT ATRIUM          Index LA diam:        3.00 cm 1.68 cm/m   RA Area:  9.86 cm LA Vol (A2C):   32.0 ml 17.96 ml/m  RA Volume:   17.30 ml 9.71 ml/m LA Vol (A4C):   25.3 ml 14.20 ml/m LA Biplane Vol: 28.8 ml 16.17 ml/m AORTIC VALVE LVOT Vmax:   85.97 cm/s LVOT Vmean:  55.200 cm/s LVOT VTI:    0.143 m  AORTA Ao Root diam: 3.50 cm Ao Asc diam:  3.20 cm   SHUNTS Systemic VTI:  0.14 m Systemic Diam: 2.00 cm  Dalton McleanMD Electronically signed by Cody Kanner Signature Date/Time: 12/09/2023/11:37:10 AM    Final (Updated)    MONITORS  LONG TERM MONITOR (3-14 DAYS) 01/04/2024  Narrative   The dominant rhythm is sinus rhythm with normal circadian variation.   There is a moderate burden of paroxysmal atrial fibrillation (9% of the recording period), made up by a single 24 hour event on 12/27-12/28. There is good  ventricular rate control at res, but there is rapid ventricular response during the daytime, presumably with activity.   There is no significant ventricular arrhythmia or bradycardia.  Abnormal event monitor due to occasional paroxysmal atrial fibrillation with period of rapid ventricular rate.   Patch Wear Time:  13 days and 20 hours  (2024-12-18T22:48:21-0500 to 2025-01-01T19:03:19-0500)  Patient had a min HR of 49 bpm, max HR of 231 bpm, and avg HR of 76 bpm. Predominant underlying rhythm was Sinus Rhythm. 1 run of Supraventricular Tachycardia occurred lasting 4 beats with a max rate of 194 bpm (avg 183 bpm). Atrial Fibrillation occurred (9% burden), ranging from 51-231 bpm (avg of 92 bpm), the longest lasting 2 hours 8 mins with an avg rate of 108 bpm. Isolated SVEs were rare (<1.0%), SVE Couplets were rare (<1.0%), and SVE Triplets were rare (<1.0%). Isolated VEs were rare (<1.0%), and no VE Couplets or VE Triplets were present. Ventricular Trigeminy was present. MD notification criteria for Rapid Atrial Fibrillation met - report posted prior to notification per account request (TS).       ______________________________________________________________________________________________       Current Reported Medications:.    Current Meds  Medication Sig   metoprolol  tartrate (LOPRESSOR ) 25 MG tablet Take 1 tablet twice a day as needed for sustained heart rate of 110    Physical Exam:    VS:  BP 120/70   Pulse 67   Ht 5' 7 (1.702 m)   Wt 146 lb 3.2 oz (66.3 kg)   SpO2 95%   BMI 22.90 kg/m    Wt Readings from Last 3 Encounters:  08/17/24 146 lb 3.2 oz (66.3 kg)  07/09/24 146 lb (66.2 kg)  06/25/24 145 lb 12.8 oz (66.1 kg)    GEN: Well nourished, well developed in no acute distress NECK: No JVD; No carotid bruits CARDIAC: RRR, no murmurs, rubs, gallops RESPIRATORY:  Clear to auscultation without rales, wheezing or rhonchi  ABDOMEN: Soft, non-tender, non-distended EXTREMITIES:  No edema; No acute deformity     Asessement and Plan:.    Atrial fibrillation: Patient with history of his paroxysmal atrial fibrillation, reports that he is now having episodes typically once a week, lasting for a full day.  With episodes he has increased fatigue and palpitations.  EKG today indicates normal sinus rhythm at 66 bpm.   Reviewed with Dr. Francyne, will refer to EP for consideration of possible ablation, discussed with this patient, he is in agreement with plan.  Low suspicion for sleep apnea, patient reports that he does not snore, denies any significant daytime somnolence. Will also start  metoprolol  tartrate 25 mg twice daily as needed. CHA2DS2-VASc Score = 0 [CHF History: 0, HTN History: 0, Diabetes History: 0, Stroke History: 0, Vascular Disease History: 0, Age Score: 0, Gender Score: 0].  Therefore, the patient's annual risk of stroke is 0.2 %.       Disposition: F/u with electrophysiology team for consideration of ablation.   Signed, Niraj Kudrna D Korry Dalgleish, NP

## 2024-08-17 ENCOUNTER — Ambulatory Visit: Attending: Cardiology | Admitting: Cardiology

## 2024-08-17 ENCOUNTER — Encounter: Payer: Self-pay | Admitting: Cardiology

## 2024-08-17 VITALS — BP 120/70 | HR 67 | Ht 67.0 in | Wt 146.2 lb

## 2024-08-17 DIAGNOSIS — I480 Paroxysmal atrial fibrillation: Secondary | ICD-10-CM

## 2024-08-17 MED ORDER — METOPROLOL TARTRATE 25 MG PO TABS: ORAL_TABLET | ORAL | 2 refills | Status: AC

## 2024-08-17 NOTE — Patient Instructions (Addendum)
 Medication Instructions:  Take Metoprolol  tartrate 25 mg twice a day as needed for sustained heart rate of 110 *If you need a refill on your cardiac medications before your next appointment, please call your pharmacy*  Lab Work: No labs  Testing/Procedures: No testing  Follow-Up: At [facility], you and your health needs are our priority.  As part of our continuing mission to provide you with exceptional heart care, our providers are all part of one team.  This team includes your primary Cardiologist (physician) and Advanced Practice Providers or APPs (Physician Assistants and Nurse Practitioners) who all work together to provide you with the care you need, when you need it.  Your next appointment:   Depends on EP  Provider:   Dr. JAYSON  Other Appointment: We are going to send a referral to EP for consideration of ablation  We recommend signing up for the patient portal called MyChart.  Sign up information is provided on this After Visit Summary.  MyChart is used to connect with patients for Virtual Visits (Telemedicine).  Patients are able to view lab/test results, encounter notes, upcoming appointments, etc.  Non-urgent messages can be sent to your provider as well.   To learn more about what you can do with MyChart, go to [email or web address].

## 2024-09-25 ENCOUNTER — Encounter: Payer: Self-pay | Admitting: Student in an Organized Health Care Education/Training Program

## 2024-09-25 ENCOUNTER — Ambulatory Visit
Attending: Student in an Organized Health Care Education/Training Program | Admitting: Student in an Organized Health Care Education/Training Program

## 2024-09-25 VITALS — BP 100/74 | HR 73 | Ht 67.0 in | Wt 144.0 lb

## 2024-09-25 DIAGNOSIS — I480 Paroxysmal atrial fibrillation: Secondary | ICD-10-CM

## 2024-09-25 NOTE — Patient Instructions (Signed)

## 2024-09-25 NOTE — Progress Notes (Signed)
 Cardiology Office Note   Date:  09/25/2024  ID:  Cody [name], DOB [birth year], MRN [id number] PCP: Kennyth Worth HERO, MD  [facility] Providers Cardiologist: Dr. Francyne   Electrophysiologist:  Donnice DELENA Primus, MD    History of Present Illness Cody [name] is a 43 y.o. male with paroxysmal atrial fibrillation who presents for arrhythmia management.  He was recently seen by Katlyn West on 08/17/2024.  He had an episode of vertigo in 09/2023 with a fall without LOC.  When he was evaluated by his PCP he was found to be in AF with controlled VR however he was asymptomatic and had no rhythm awareness.  He wore subsequent monitor and had an episode of prolonged AF of 24-hour duration with RVR with exertional activity and controlled VR with normal activity.  No preceding arrhythmias precipitating the AF.  He had subsequent TTE with no significant abnormalities but was in AF during that echocardiogram.  He was then seen by Dr. Francyne on 01/19/2024 and was still asymptomatic and very physically active.  He was taking Toprol -XL nightly.  He was still walking 1.5 miles a day.  At his most recent visit he was having palpitations once weekly that were often lasting 24 hours.  He also has associated palpitations and fatigue when he is in AF.  He had significant fatigue with Toprol -XL and was unable to tolerate medication.  He was switched metoprolol  to tartrate 25 mg twice daily as needed.  CHA2DS2-VASc 0 so not on OAC.  Cody [name] presents for follow-up today.  He is still having intermittent episodes of palpitations consistent with AF.  One of the episodes woke about from sleep in the morning.  The longer episodes are lasting the majority of the day but often are gone when he wakes up the following morning.  He does not have known family history of arrhythmias.  He said that in [place] for most of his extended family with healthcare access is limited.  He lives locally with his wife and 2 kids.  His immediate family lives here. His extended family is in [place]. He works as an Acupuncturist.  ROS: palpitations  Studies Reviewed  ECG review 08/17/24: NSR 66, PR 168, QRS 68, QT/c 372/389 12/05/23: NSR 74, PR 168, QRS 86, QT/c 368/408   Zio monitor  Result date: 12/14/23-12/28/23   The dominant rhythm is sinus rhythm with normal circadian variation.   There is a moderate burden of paroxysmal atrial fibrillation (9% of the recording period), made up by a single 24 hour event on 12/27-12/28. There is good  ventricular rate control at res, but there is rapid ventricular response during the daytime, presumably with activity.   There is no significant ventricular arrhythmia or bradycardia. Abnormal event monitor due to occasional paroxysmal atrial fibrillation with period of rapid ventricular rate. Patch Wear Time:  13 days and 20 hours (2024-12-18T22:48:21-0500 to 2025-01-01T19:03:19-0500) Patient had a min HR of 49 bpm, max HR of 231 bpm, and avg HR of 76 bpm. Predominant underlying rhythm was Sinus Rhythm. 1 run of Supraventricular Tachycardia occurred lasting 4 beats with a max rate of 194 bpm (avg 183 bpm). Atrial Fibrillation occurred  (9% burden), ranging from 51-231 bpm (avg of 92 bpm), the longest lasting 2 hours 8 mins with an avg rate of 108 bpm. Isolated SVEs were rare (<1.0%), SVE Couplets were rare (<1.0%), and SVE Triplets were rare (<1.0%). Isolated VEs were rare (<1.0%),  and no VE Couplets or VE Triplets were present.  Ventricular Trigeminy was present. MD notification criteria for Rapid Atrial Fibrillation met - report posted prior to notification per account request (TS).  TTE Result date: 12/09/23  1. Left ventricular ejection fraction, by estimation, is 60 to 65%. The  left ventricle has normal function. The left ventricle has no regional  wall motion abnormalities. Left ventricular diastolic parameters are  indeterminate.   2. Right ventricular systolic  function is normal. The right ventricular  size is normal. Tricuspid regurgitation signal is inadequate for assessing  PA pressure.   3. The mitral valve is normal in structure. Trivial mitral valve  regurgitation. No evidence of mitral stenosis.   4. The aortic valve is tricuspid. Aortic valve regurgitation is not  visualized. No aortic stenosis is present.   5. The inferior vena cava is normal in size with greater than 50%  respiratory variability, suggesting right atrial pressure of 3 mmHg.   6. The patient was in atrial fibrillation.   Risk Assessment/Calculations  CHA2DS2-VASc Score = 0  This indicates a 0.2% annual risk of stroke. The patient's score is based upon: CHF History: 0 HTN History: 0 Diabetes History: 0 Stroke History: 0 Vascular Disease History: 0 Age Score: 0 Gender Score: 0  Physical Exam VS:  BP 100/74   Pulse 73   Ht 5' 7 (1.702 m)   Wt 144 lb (65.3 kg)   SpO2 95%   BMI 22.55 kg/m       Wt Readings from Last 3 Encounters:  09/25/24 144 lb (65.3 kg)  08/17/24 146 lb 3.2 oz (66.3 kg)  07/09/24 146 lb (66.2 kg)    GEN: Well nourished, well developed in no acute distress CARDIAC: RRR, no murmurs, rubs, gallops RESPIRATORY:  Clear to auscultation without rales, wheezing or rhonchi  ABDOMEN: Soft, non-tender, non-distended EXTREMITIES:  No edema; No deformity   ASSESSMENT AND PLAN Cody [name] is a 43 y.o. male with paroxysmal atrial fibrillation who presents for arrhythmia management.  Paroxysmal AF We reviewed different options for management of his atrial fibrillation.  I reviewed his Holter monitor which shows both rate controlled AF and AF with RVR.  There is not seem to be other arrhythmias that would have precipitated his AF.  Offered different options for management of AF including antiarrhythmic medications and ablation.  I think the success rate of ablation with his lack of risk factors would be high at dramatically reducing eliminating future  AF episodes.  Recent TTE with no left atrial dilation.  Discussed treatment options today for AF including antiarrhythmic drug therapy and ablation. Discussed risks, recovery and likelihood of success with each treatment strategy. Risk, benefits, and alternatives to EP study and ablation for afib were discussed. These risks include but are not limited to stroke, bleeding, vascular damage, tamponade, perforation, damage to the esophagus, lungs, phrenic nerve and other structures, worsening renal function, coronary vasospasm and death.  Discussed potential need for repeat ablation procedures and antiarrhythmic drugs after an initial ablation. The patient understands these risks and wants to consider his options further.  I encouraged him to reach out to me if he would like to proceed with either antiarrhythmic medication or catheter ablation.  We discussed that the burden of AF will likely increase with time.  I encouraged him to consider rhythm control as I think her chance of success is much higher earlier in the progression of his arrhythmia.  If he decides to proceed with ablation he will need apixaban 5 mg twice daily 30 days before  90 days after ablation  A total of 69 minutes was spent preparing for the patient, reviewing history, performing exam, document encounter, coordinating care and counseling the patient. 40 minutes was spent with direct patient care.   Dispo: f/u 6 months   Signed, Donnice DELENA Primus, MD
